# Patient Record
Sex: Female | Born: 1970
Health system: Southern US, Community
[De-identification: ages and names within clinical notes are randomized; demographics above are authoritative.]

## PROBLEM LIST (undated history)

## (undated) DIAGNOSIS — F419 Anxiety disorder, unspecified: Secondary | ICD-10-CM

## (undated) DIAGNOSIS — M1990 Unspecified osteoarthritis, unspecified site: Secondary | ICD-10-CM

## (undated) DIAGNOSIS — G4700 Insomnia, unspecified: Secondary | ICD-10-CM

## (undated) DIAGNOSIS — F32A Depression, unspecified: Secondary | ICD-10-CM

## (undated) DIAGNOSIS — I10 Essential (primary) hypertension: Secondary | ICD-10-CM

## (undated) DIAGNOSIS — E7800 Pure hypercholesterolemia, unspecified: Secondary | ICD-10-CM

## (undated) DIAGNOSIS — F329 Major depressive disorder, single episode, unspecified: Secondary | ICD-10-CM

## (undated) HISTORY — DX: Anxiety disorder, unspecified: F41.9

## (undated) HISTORY — DX: Essential (primary) hypertension: I10

## (undated) HISTORY — DX: Insomnia, unspecified: G47.00

## (undated) HISTORY — DX: Depression, unspecified: F32.A

## (undated) HISTORY — DX: Unspecified osteoarthritis, unspecified site: M19.90

## (undated) HISTORY — DX: Pure hypercholesterolemia, unspecified: E78.00

---
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
## 2006-09-09 DIAGNOSIS — N189 Chronic kidney disease, unspecified: Secondary | ICD-10-CM

## 2006-09-09 HISTORY — DX: Chronic kidney disease, unspecified: N18.9

## 2006-09-09 HISTORY — PX: NEPHRECTOMY: SHX65

## 2008-09-09 HISTORY — PX: VAGINAL HYSTERECTOMY: SUR661

## 2018-10-06 DIAGNOSIS — F4321 Adjustment disorder with depressed mood: Secondary | ICD-10-CM | POA: Insufficient documentation

## 2018-10-06 DIAGNOSIS — I10 Essential (primary) hypertension: Secondary | ICD-10-CM | POA: Insufficient documentation

## 2018-10-06 DIAGNOSIS — G4700 Insomnia, unspecified: Secondary | ICD-10-CM | POA: Insufficient documentation

## 2018-10-06 DIAGNOSIS — F411 Generalized anxiety disorder: Secondary | ICD-10-CM | POA: Insufficient documentation

## 2018-12-08 DIAGNOSIS — R5382 Chronic fatigue, unspecified: Secondary | ICD-10-CM | POA: Insufficient documentation

## 2018-12-31 DIAGNOSIS — M2550 Pain in unspecified joint: Secondary | ICD-10-CM | POA: Insufficient documentation

## 2019-08-12 DIAGNOSIS — E782 Mixed hyperlipidemia: Secondary | ICD-10-CM | POA: Insufficient documentation

## 2020-01-04 DIAGNOSIS — N9089 Other specified noninflammatory disorders of vulva and perineum: Secondary | ICD-10-CM | POA: Diagnosis not present

## 2020-01-04 DIAGNOSIS — N898 Other specified noninflammatory disorders of vagina: Secondary | ICD-10-CM | POA: Diagnosis not present

## 2020-01-05 DIAGNOSIS — N898 Other specified noninflammatory disorders of vagina: Secondary | ICD-10-CM | POA: Diagnosis not present

## 2020-01-07 DIAGNOSIS — M25562 Pain in left knee: Secondary | ICD-10-CM | POA: Diagnosis not present

## 2020-01-07 DIAGNOSIS — M1711 Unilateral primary osteoarthritis, right knee: Secondary | ICD-10-CM | POA: Diagnosis not present

## 2020-01-07 DIAGNOSIS — M25512 Pain in left shoulder: Secondary | ICD-10-CM | POA: Diagnosis not present

## 2020-01-07 DIAGNOSIS — M25561 Pain in right knee: Secondary | ICD-10-CM | POA: Diagnosis not present

## 2020-01-07 DIAGNOSIS — M170 Bilateral primary osteoarthritis of knee: Secondary | ICD-10-CM | POA: Diagnosis not present

## 2020-01-19 ENCOUNTER — Other Ambulatory Visit: Payer: Self-pay

## 2020-01-19 ENCOUNTER — Encounter: Payer: Self-pay | Admitting: Family

## 2020-01-19 ENCOUNTER — Ambulatory Visit (INDEPENDENT_AMBULATORY_CARE_PROVIDER_SITE_OTHER): Payer: BC Managed Care – PPO | Admitting: Family

## 2020-01-19 VITALS — BP 120/82 | HR 62 | Temp 97.1°F | Resp 16 | Ht 63.0 in | Wt 150.4 lb

## 2020-01-19 DIAGNOSIS — M159 Polyosteoarthritis, unspecified: Secondary | ICD-10-CM | POA: Insufficient documentation

## 2020-01-19 DIAGNOSIS — F321 Major depressive disorder, single episode, moderate: Secondary | ICD-10-CM

## 2020-01-19 DIAGNOSIS — I10 Essential (primary) hypertension: Secondary | ICD-10-CM | POA: Diagnosis not present

## 2020-01-19 DIAGNOSIS — R519 Headache, unspecified: Secondary | ICD-10-CM

## 2020-01-19 DIAGNOSIS — Z23 Encounter for immunization: Secondary | ICD-10-CM

## 2020-01-19 DIAGNOSIS — E785 Hyperlipidemia, unspecified: Secondary | ICD-10-CM | POA: Diagnosis not present

## 2020-01-19 DIAGNOSIS — R252 Cramp and spasm: Secondary | ICD-10-CM

## 2020-01-19 DIAGNOSIS — M8949 Other hypertrophic osteoarthropathy, multiple sites: Secondary | ICD-10-CM

## 2020-01-19 DIAGNOSIS — F5101 Primary insomnia: Secondary | ICD-10-CM

## 2020-01-19 DIAGNOSIS — F411 Generalized anxiety disorder: Secondary | ICD-10-CM

## 2020-01-19 DIAGNOSIS — E559 Vitamin D deficiency, unspecified: Secondary | ICD-10-CM | POA: Insufficient documentation

## 2020-01-19 DIAGNOSIS — R7989 Other specified abnormal findings of blood chemistry: Secondary | ICD-10-CM

## 2020-01-19 DIAGNOSIS — Z113 Encounter for screening for infections with a predominantly sexual mode of transmission: Secondary | ICD-10-CM

## 2020-01-19 DIAGNOSIS — Z6826 Body mass index (BMI) 26.0-26.9, adult: Secondary | ICD-10-CM

## 2020-01-19 DIAGNOSIS — R5383 Other fatigue: Secondary | ICD-10-CM

## 2020-01-19 MED ORDER — TETANUS-DIPHTH-ACELL PERTUSSIS 5-2.5-18.5 LF-MCG/0.5 IM SUSP: 0.5000 mL | Freq: Once | INTRAMUSCULAR | 0 refills | Status: AC

## 2020-01-19 NOTE — Patient Instructions (Signed)
-   continue current medication  - Schedule appointment for fasting lab work in 2-4 days or sooner  Welcome to [facility].

## 2020-01-19 NOTE — Progress Notes (Signed)
Provider: Marlowe Sax FNP-C   , Nelda Bucks, NP  Patient Care Team: , Nelda Bucks, NP as PCP - General (Family Medicine)  Extended Emergency Contact Information Primary Emergency Contact: zyon, grout Mobile Phone: [phone] Relation: Spouse  Code Status: Full Code  Goals of care: Advanced Directive information Advanced Directives 01/19/2020  Does Patient Have a Medical Advance Directive? No     Chief Complaint  Patient presents with  . Establish Care    New Patient   . Health Maintenance    Discuss the need for HIV Screening, and Pap Smear.  . Immunizations    Discuss the need for Tetanus Vaccine.    HPI:  Pt is a 49 y.o. female seen today to establish care for medical management of chronic diseases.She has a medical history of Hypertension ,Generali anxiety disorder,depression,chronic Kidney disease,Hyperlipidemia,Insomnia,allergic Rhinitis among other condition.she states recently relocated from [place] due to [employer] job.she was following up with McClees Angela,PA at [facility]. Also seen by OB/Gyn at [facility] for gental lesion.she was treated for Folliculitis.she has established with OB/GYN here in [place] states was seen a week ago had a pap smear done.doesn't recall the Gyn office name but will call.she complains of feeling tired most of the time and not being focused.  Hypertension - on losartan 50 mg tablet daily and metoprolol 100 mg 24 Hr tablet daily.checks blood pressure sometimes at home.No home readings for evaluation.Has had some headaches sometimes at the end of the day but goes away.No chest pain,palpitation or shortness of breath.   Hyperlipidemia - not on any medication.tries to watch what she eats.exercise is limited due to her osteoarthritis on the knees and left shoulder but tries to exercise occasionally about twice a week sometimes.  Insomnia - on Ambien 10 mg tablet daily at bedtime.states was  previous given Trazodone and other sleep aids but nothing works for her except Ambien.Has had no side effects.  Depression - on Celexa 40 mg tablet daily.States has been on Celexa for several years.Has failed other antidepressant prescribed by Psychiatrist including Cymbalta,subvenite,lamotragine .States depression seems to be stable for now has not had any up and downs.Dose was adjusted while in [place] due to death of her brother.Request referral for a Counsellor states has upcoming appointment with Psychiatry. Drinks 7 glasses of Alcohol per week  Also drinks coffee   Osteoarthritis - states has chronic bulging disc on the neck,left shoulder and bilateral knee pain.she gets cortisol injection with Orthopedic.she has established with Dr.Bean at [facility].States had a cortisol injection on right knee which seems to have helped with the pain.Plans to get gel injection on next visit.No records for review.   She is due for Tdap vaccine.discussed importance of getting Tdap vaccine verbalized understanding.due for HIV screening  Has not had any COVID-19 vaccine.states doesn't get yearly influence vaccine tries to eat health and takes vitamin C supplements.   Past Medical History:  Diagnosis Date  . Anxiety   . Arthritis   . Chronic kidney disease 2008   Nephrectomy   . Depression   . High blood pressure   . High cholesterol   . Insomnia    Past Surgical History:  Procedure Laterality Date  . NEPHRECTOMY  2008   Done in [place]  . VAGINAL HYSTERECTOMY  2010   Dr. Lucia Bitter    Allergies  Allergen Reactions  . Benzocaine-Menthol   . Cholecalciferol   . Codeine   . Penicillins   . Skin  Adhesives [Cyanoacrylate]   . Sulfa Antibiotics     Allergies as of 01/19/2020      Reactions   Benzocaine-menthol    Cholecalciferol    Codeine    Penicillins    Skin Adhesives [cyanoacrylate]    Sulfa Antibiotics       Medication List       Accurate as of Jan 19, 2020  9:17 PM.  If you have any questions, ask your nurse or doctor.        ALPRAZolam 0.25 MG tablet Commonly known as: XANAX Take 0.25 mg by mouth as needed for anxiety.   citalopram 40 MG tablet Commonly known as: CELEXA Take 40 mg by mouth daily.   losartan 50 MG tablet Commonly known as: COZAAR Take 50 mg by mouth daily.   MAGNESIUM BISGLYCINATE PO Take 1 capsule by mouth in the morning and at bedtime.   metoprolol succinate 100 MG 24 hr tablet Commonly known as: TOPROL-XL Take 100 mg by mouth daily. Take with or immediately following a meal. (Patient states 75 mg +25 mg)   OVER THE COUNTER MEDICATION Take 1 capsule by mouth daily. Magnesium L Threonate   Tdap 5-2.5-18.5 LF-MCG/0.5 injection Commonly known as: BOOSTRIX Inject 0.5 mLs into the muscle once for 1 dose.   testosterone cypionate 200 MG/ML injection Commonly known as: DEPOTESTOSTERONE CYPIONATE Inject 0.15 mg into the muscle every 14 (fourteen) days.   Vitamin D (Ergocalciferol) 1.25 MG (50000 UNIT) Caps capsule Commonly known as: DRISDOL Take 5,000 Units by mouth daily.   zolpidem 10 MG tablet Commonly known as: AMBIEN Take 10 mg by mouth at bedtime.       Review of Systems  Constitutional: Negative for activity change, appetite change, chills, fatigue and fever.  HENT: Negative for congestion, dental problem, postnasal drip, rhinorrhea, sinus pressure, sinus pain, sneezing, sore throat and trouble swallowing.   Eyes: Negative for discharge, redness, itching and visual disturbance.  Respiratory: Negative for cough, chest tightness, shortness of breath and wheezing.   Cardiovascular: Negative for chest pain, palpitations and leg swelling.  Gastrointestinal: Negative for abdominal distention, abdominal pain, blood in stool, constipation, diarrhea, nausea and vomiting.  Endocrine: Negative for cold intolerance, heat intolerance, polydipsia, polyphagia and polyuria.       Hot flush sometimes   Genitourinary:  Negative for decreased urine volume, difficulty urinating, dysuria, flank pain, frequency and urgency.  Musculoskeletal: Positive for arthralgias. Negative for gait problem, myalgias and neck pain.  Skin: Negative for color change, pallor, rash and wound.  Neurological: Negative for dizziness, speech difficulty, weakness, light-headedness and numbness.       Headaches sometimes in the evening.   Hematological: Does not bruise/bleed easily.  Psychiatric/Behavioral: Positive for sleep disturbance. Negative for agitation, behavioral problems and confusion. The patient is nervous/anxious.     There is no immunization history on file for this patient. Pertinent  Health Maintenance Due  Topic Date Due  . PAP SMEAR-Modifier  Never done  . INFLUENZA VACCINE  04/09/2020   Fall Risk  01/19/2020  Falls in the past year? 0  Number falls in past yr: 0  Injury with Fall? 0     Vitals:   01/19/20 0914  BP: 120/82  Pulse: 62  Resp: 16  Temp: (!) 97.1 F (36.2 C)  SpO2: 95%  Weight: 150 lb 6.4 oz (68.2 kg)  Height: _0  (1.6 m)   Body mass index is 26.64 kg/m. Physical Exam Vitals reviewed.  Constitutional:  General: She is not in acute distress.    Appearance: She is overweight. She is not ill-appearing.  HENT:     Head: Normocephalic.     Right Ear: Tympanic membrane, ear canal and external ear normal. There is no impacted cerumen.     Left Ear: Tympanic membrane, ear canal and external ear normal. There is no impacted cerumen.     Nose: Nose normal. No congestion or rhinorrhea.     Mouth/Throat:     Mouth: Mucous membranes are moist.     Pharynx: Oropharynx is clear. No oropharyngeal exudate or posterior oropharyngeal erythema.  Eyes:     General: No scleral icterus.       Right eye: No discharge.        Left eye: No discharge.     Extraocular Movements: Extraocular movements intact.     Conjunctiva/sclera: Conjunctivae normal.     Pupils: Pupils are equal, round, and  reactive to light.  Neck:     Vascular: No carotid bruit.  Cardiovascular:     Rate and Rhythm: Normal rate and regular rhythm.     Pulses: Normal pulses.     Heart sounds: Normal heart sounds. No murmur. No friction rub. No gallop.   Pulmonary:     Effort: Pulmonary effort is normal. No respiratory distress.     Breath sounds: Normal breath sounds. No wheezing, rhonchi or rales.  Chest:     Chest wall: No tenderness.  Abdominal:     General: Bowel sounds are normal. There is no distension.     Palpations: Abdomen is soft. There is no mass.     Tenderness: There is no abdominal tenderness. There is no right CVA tenderness, left CVA tenderness, guarding or rebound.  Musculoskeletal:        General: No swelling or tenderness. Normal range of motion.     Cervical back: Normal range of motion. No rigidity or tenderness.     Right lower leg: No edema.     Left lower leg: No edema.  Lymphadenopathy:     Cervical: No cervical adenopathy.  Skin:    General: Skin is warm.     Coloration: Skin is not pale.     Findings: No bruising, erythema or rash.  Neurological:     Mental Status: She is alert and oriented to person, place, and time.     Cranial Nerves: No cranial nerve deficit.     Sensory: No sensory deficit.     Motor: No weakness.     Coordination: Coordination normal.     Gait: Gait normal.  Psychiatric:        Mood and Affect: Mood normal.        Behavior: Behavior normal.        Thought Content: Thought content normal.        Judgment: Judgment normal.    Labs reviewed: Significant Diagnostic Results in last 30 days:  No results found.  Assessment/Plan 1. Essential hypertension B/p at goal. - continue on  losartan 50 mg tablet daily and metoprolol 100 mg 24 Hr tablet daily - Advised to check B/p at home and record.Notify provider's office if > 140/90 - CBC with Differential/Platelet; Future - CMP with eGFR([facility]); Future - TSH; Future  2. Hyperlipidemia LDL goal  <100 No recent LDL for review. - Encouraged to continue on dietary modification and exercise.Advised to do water aerobics due to her knees and shoulder pain. - Lipid panel; Future  3. Generalized anxiety disorder -  non pharmacological coping mechanism advised.Telephone numbers for [facility] area counselors provided for patient to call and establish care. - Continue on Alprazolam 0.25 mg tablet as needed.  - Has upcoming appointment to establish care with Psychiatrist in town.   4. Current moderate episode of major depressive disorder, unspecified whether recurrent (HCC) Mood stable. - Follow up with Psychiatrist as scheduled.several Telephone numbers for counselors given. - continue on Celexa 40 mg tablet daily.  - TSH; Future  5. Need for Tdap vaccination Advised to get Tdap vaccine at her pharmacy.script send to pharmacy. - Tdap (BOOSTRIX) 5-2.5-18.5 LF-MCG/0.5 injection; Inject 0.5 mLs into the muscle once for 1 dose.  Dispense: 0.5 mL; Refill: 0  6. Screening examination for venereal disease Discussed screening verbalized understanding and agrees for HIV lab to e ordered.   - HIV antibody (with reflex); Future  7. Body mass index 26.0-26.9, adult  -  Overweight  - BMI 26.64  Dietary and exercise modification advised  8. Vitamin D deficiency - continue on vitamin D 5000 capsule - Vitamin D, 1,25-dihydroxy; Future  9. Fatigue, unspecified type Unclear etiology. - encouraged good balance diet,exercise and sleep 6-8 hrs. Will check vitamin B12 level.  - Vitamin B12; Future  10. Muscle cramps - Encouraged to increase water intake to 6-8 glasses daily  - continue on Magnesium capsule one by mouth twice daily.  - Magnesium; Future  11. Nonintractable episodic headache, unspecified headache type Intermittent headaches at the end of the day which resolve.suspect possible stress related. - encouraged to increase water intake  - over the counter analgesics as needed.will  consider referral to Neurologist if headaches worsen.  - CBC with Differential/Platelet; Future  12. Low testosterone level in female - chronic  - continue on Testosterone 0.15 mg injection every 14 days as directed by [place].  13 Insomnia  Chronic  Zolpidem side effects discussed.verbalized understanding has tried several other medication but nothing works for her. - continue on Zolpidem 10 mg tablet daily  - follow up with Psychiatrist as scheduled.   14.Osteoarthritis of multiple sites  Bilateral knees,left shoulder and neck  - continue to follow up with Orthopedic specialist for cortisol injection as directed.   15.Hx of Nephrectomy  Reports hx of nephrectomy in 2008.No records for review.  - Avoid NSAIDs, and dose other medication for renal clearances.  Family/ staff Communication: Reviewed plan of care with patient verbalized understanding.   Labs/tests ordered:   - CBC with Differential/Platelet; Future - CMP with eGFR([facility]); Future - TSH; Future - HIV antibody (with reflex); Future - Vitamin B12; Future - Lipid panel; Future  Next Appointment : 6 months for medical management of chronic issues.Labs in 2-4 days or sooner.  Sandrea Hughs, NP

## 2020-01-21 ENCOUNTER — Other Ambulatory Visit: Payer: Self-pay

## 2020-01-21 ENCOUNTER — Other Ambulatory Visit: Payer: BC Managed Care – PPO

## 2020-01-21 ENCOUNTER — Telehealth: Payer: Self-pay | Admitting: *Deleted

## 2020-01-21 DIAGNOSIS — K5901 Slow transit constipation: Secondary | ICD-10-CM

## 2020-01-21 DIAGNOSIS — E785 Hyperlipidemia, unspecified: Secondary | ICD-10-CM

## 2020-01-21 DIAGNOSIS — E559 Vitamin D deficiency, unspecified: Secondary | ICD-10-CM | POA: Diagnosis not present

## 2020-01-21 DIAGNOSIS — Z113 Encounter for screening for infections with a predominantly sexual mode of transmission: Secondary | ICD-10-CM

## 2020-01-21 DIAGNOSIS — I10 Essential (primary) hypertension: Secondary | ICD-10-CM | POA: Diagnosis not present

## 2020-01-21 DIAGNOSIS — R5383 Other fatigue: Secondary | ICD-10-CM | POA: Diagnosis not present

## 2020-01-21 DIAGNOSIS — R252 Cramp and spasm: Secondary | ICD-10-CM

## 2020-01-21 MED ORDER — METOPROLOL SUCCINATE ER 50 MG PO TB24: ORAL_TABLET | ORAL | 3 refills | Status: DC

## 2020-01-21 MED ORDER — CITALOPRAM HYDROBROMIDE 40 MG PO TABS: 40.0000 mg | ORAL_TABLET | Freq: Every day | ORAL | 1 refills | Status: DC

## 2020-01-21 MED ORDER — ZOLPIDEM TARTRATE 10 MG PO TABS: 10.0000 mg | ORAL_TABLET | Freq: Every day | ORAL | Status: DC

## 2020-01-21 MED ORDER — ALPRAZOLAM 0.25 MG PO TABS: ORAL_TABLET | ORAL | 1 refills | Status: DC

## 2020-01-21 MED ORDER — LINACLOTIDE 72 MCG PO CAPS: 72.0000 ug | ORAL_CAPSULE | Freq: Every day | ORAL | 2 refills | Status: DC

## 2020-01-21 MED ORDER — METOPROLOL SUCCINATE ER 25 MG PO TB24: ORAL_TABLET | ORAL | 3 refills | Status: DC

## 2020-01-21 NOTE — Telephone Encounter (Signed)
Okay 

## 2020-01-21 NOTE — Telephone Encounter (Signed)
72 MCG  rx sent to pharmacy by e-script

## 2020-01-21 NOTE — Telephone Encounter (Signed)
Patient walked into office with her bottles of medications.  Reviewed medications.Patient did not have bottles for Alprazolam, Citalopram and Ambien that is prescribed by Psychiatrist per patient. Also DID NOT have bottle for Testosterone Cypionate.    Spoke with Dinah and she stated that patient should not be taking: Metoprolol Tartrate and Metoprolol Succ. Dinah stated to send in Rx for Metoprolol Succ ER 50mg  once daily along with Metoprolol Succ ER 25mg  once daily to total 75mg  daily.   Corrected medication in current medication list.  Patient notified to STOP Metoprolol Tart 50 twice daily. Agreed to start Metoprolol Succ 25mg  and 50mg . For total of 75mg  daily.   Patient aware of the directions and agreed.   Pended Rx's and sent to [facility] for approval.

## 2020-01-21 NOTE — Telephone Encounter (Signed)
Patient called and would like a prescription for Linzess to make he have a regular bowel movement. She was on it in the pass and stated it works well for her. Please advise .

## 2020-01-21 NOTE — Telephone Encounter (Signed)
How much was she taking.Okay to fill.

## 2020-01-25 ENCOUNTER — Other Ambulatory Visit: Payer: Self-pay

## 2020-01-25 DIAGNOSIS — E785 Hyperlipidemia, unspecified: Secondary | ICD-10-CM

## 2020-01-25 DIAGNOSIS — I10 Essential (primary) hypertension: Secondary | ICD-10-CM

## 2020-01-26 ENCOUNTER — Other Ambulatory Visit: Payer: Self-pay

## 2020-01-26 DIAGNOSIS — E785 Hyperlipidemia, unspecified: Secondary | ICD-10-CM

## 2020-01-26 LAB — LIPID PANEL
Cholesterol: 210 mg/dL — ABNORMAL HIGH
HDL: 41 mg/dL — ABNORMAL LOW
LDL Cholesterol (Calc): 141 mg/dL — ABNORMAL HIGH
Non-HDL Cholesterol (Calc): 169 mg/dL — ABNORMAL HIGH
Total CHOL/HDL Ratio: 5.1 (calc) — ABNORMAL HIGH
Triglycerides: 151 mg/dL — ABNORMAL HIGH

## 2020-01-26 LAB — COMPLETE METABOLIC PANEL WITHOUT GFR
AG Ratio: 1.5 (calc) (ref 1.0–2.5)
ALT: 10 U/L (ref 6–29)
AST: 12 U/L (ref 10–35)
Albumin: 4 g/dL (ref 3.6–5.1)
Alkaline phosphatase (APISO): 38 U/L (ref 31–125)
BUN: 9 mg/dL (ref 7–25)
CO2: 27 mmol/L (ref 20–32)
Calcium: 9.2 mg/dL (ref 8.6–10.2)
Chloride: 105 mmol/L (ref 98–110)
Creat: 0.76 mg/dL (ref 0.50–1.10)
GFR, Est African American: 108 mL/min/1.73m2
GFR, Est Non African American: 93 mL/min/1.73m2
Globulin: 2.6 g/dL (ref 1.9–3.7)
Glucose, Bld: 90 mg/dL (ref 65–99)
Potassium: 4.3 mmol/L (ref 3.5–5.3)
Sodium: 137 mmol/L (ref 135–146)
Total Bilirubin: 1.3 mg/dL — ABNORMAL HIGH (ref 0.2–1.2)
Total Protein: 6.6 g/dL (ref 6.1–8.1)

## 2020-01-26 LAB — VITAMIN B12: Vitamin B-12: 696 pg/mL (ref 200–1100)

## 2020-01-26 LAB — HIV ANTIBODY (ROUTINE TESTING W REFLEX): HIV 1&2 Ab, 4th Generation: NONREACTIVE

## 2020-01-26 LAB — MAGNESIUM: Magnesium: 1.7 mg/dL (ref 1.5–2.5)

## 2020-01-26 LAB — VITAMIN D 1,25 DIHYDROXY
Vitamin D 1, 25 (OH)2 Total: 55 pg/mL (ref 18–72)
Vitamin D2 1, 25 (OH)2: 11 pg/mL
Vitamin D3 1, 25 (OH)2: 44 pg/mL

## 2020-01-26 LAB — TSH: TSH: 1.85 mIU/L

## 2020-01-26 MED ORDER — ROSUVASTATIN CALCIUM 5 MG PO TABS: 5.0000 mg | ORAL_TABLET | Freq: Every day | ORAL | 3 refills | Status: DC

## 2020-01-26 NOTE — Telephone Encounter (Signed)
Patient with HLD. Plan was for followup labs in 6 months. Patient stated she had had HLD for a while and would rather start on medication sooner. Crestor 5 mg, #30 x3 sent to pharmacy and patient notified.

## 2020-01-31 DIAGNOSIS — M7542 Impingement syndrome of left shoulder: Secondary | ICD-10-CM | POA: Diagnosis not present

## 2020-01-31 DIAGNOSIS — M25512 Pain in left shoulder: Secondary | ICD-10-CM | POA: Diagnosis not present

## 2020-01-31 DIAGNOSIS — M25562 Pain in left knee: Secondary | ICD-10-CM | POA: Diagnosis not present

## 2020-01-31 DIAGNOSIS — M25561 Pain in right knee: Secondary | ICD-10-CM | POA: Diagnosis not present

## 2020-02-22 DIAGNOSIS — M170 Bilateral primary osteoarthritis of knee: Secondary | ICD-10-CM | POA: Diagnosis not present

## 2020-03-06 ENCOUNTER — Encounter: Payer: BC Managed Care – PPO | Admitting: Obstetrics and Gynecology

## 2020-03-09 DIAGNOSIS — M170 Bilateral primary osteoarthritis of knee: Secondary | ICD-10-CM | POA: Diagnosis not present

## 2020-03-14 ENCOUNTER — Telehealth: Payer: Self-pay

## 2020-03-14 NOTE — Telephone Encounter (Signed)
Patient called to ask if [facility] would take over the care of her medications since she is a new [place] the 2 of them in particular are her Ambien and Celexa I informed her that a refill for these 2 medications was sent to her pharmacy on 01/21/2020 and she should check to see if they could still do the refill request if not to call the office and we could resend it for her

## 2020-03-22 DIAGNOSIS — M170 Bilateral primary osteoarthritis of knee: Secondary | ICD-10-CM | POA: Diagnosis not present

## 2020-03-31 DIAGNOSIS — M170 Bilateral primary osteoarthritis of knee: Secondary | ICD-10-CM | POA: Diagnosis not present

## 2020-03-31 DIAGNOSIS — M25571 Pain in right ankle and joints of right foot: Secondary | ICD-10-CM | POA: Diagnosis not present

## 2020-04-05 ENCOUNTER — Other Ambulatory Visit: Payer: Self-pay | Admitting: Family

## 2020-04-05 DIAGNOSIS — Z1231 Encounter for screening mammogram for malignant neoplasm of breast: Secondary | ICD-10-CM | POA: Diagnosis not present

## 2020-04-09 ENCOUNTER — Other Ambulatory Visit: Payer: Self-pay | Admitting: Family

## 2020-04-10 ENCOUNTER — Other Ambulatory Visit: Payer: Self-pay | Admitting: *Deleted

## 2020-04-10 MED ORDER — CITALOPRAM HYDROBROMIDE 40 MG PO TABS: 40.0000 mg | ORAL_TABLET | Freq: Every day | ORAL | 0 refills | Status: DC

## 2020-04-10 MED ORDER — ALPRAZOLAM 0.25 MG PO TABS: ORAL_TABLET | ORAL | 0 refills | Status: DC

## 2020-04-10 NOTE — Telephone Encounter (Signed)
Patient called and stated that she needs refills on medications.  I informed her that these need to be prescribed by the Psych.  She stated that she has not found one yet and they are all booked out.   The numbers to [facility] and [facility] given to patient to call.   Wants to know if you will refill until she can get into one.  Please Advise.

## 2020-04-11 ENCOUNTER — Telehealth: Payer: Self-pay | Admitting: *Deleted

## 2020-04-11 NOTE — Telephone Encounter (Signed)
Please schedule appointment to check blood pressure at the office.

## 2020-04-11 NOTE — Telephone Encounter (Signed)
Patient called with blood pressure readings: 04/09/20  144/91 04/10/20  165/110 04/11/20  137/92  Patient is currently taking Metoprolol 100mg  once daily and Losartan 50mg  once daily.   Patient is wondering if her Xanax or Celexa could be increased. Stated that this is the only thing that helps calm her down.   Please Advise.

## 2020-04-12 NOTE — Telephone Encounter (Signed)
LMOM to return call.

## 2020-04-13 NOTE — Telephone Encounter (Signed)
Patient notified and agreed. Appointment scheduled with Dinah for 04/14/2020.

## 2020-04-14 ENCOUNTER — Other Ambulatory Visit: Payer: Self-pay

## 2020-04-14 ENCOUNTER — Encounter: Payer: Self-pay | Admitting: Family

## 2020-04-14 ENCOUNTER — Ambulatory Visit (INDEPENDENT_AMBULATORY_CARE_PROVIDER_SITE_OTHER): Payer: BC Managed Care – PPO | Admitting: Family

## 2020-04-14 VITALS — BP 132/92 | HR 90 | Temp 97.7°F | Resp 16 | Ht 63.0 in | Wt 151.0 lb

## 2020-04-14 DIAGNOSIS — F411 Generalized anxiety disorder: Secondary | ICD-10-CM

## 2020-04-14 DIAGNOSIS — M8949 Other hypertrophic osteoarthropathy, multiple sites: Secondary | ICD-10-CM

## 2020-04-14 DIAGNOSIS — F321 Major depressive disorder, single episode, moderate: Secondary | ICD-10-CM

## 2020-04-14 DIAGNOSIS — F5101 Primary insomnia: Secondary | ICD-10-CM

## 2020-04-14 DIAGNOSIS — E785 Hyperlipidemia, unspecified: Secondary | ICD-10-CM

## 2020-04-14 DIAGNOSIS — M159 Polyosteoarthritis, unspecified: Secondary | ICD-10-CM

## 2020-04-14 DIAGNOSIS — I10 Essential (primary) hypertension: Secondary | ICD-10-CM | POA: Diagnosis not present

## 2020-04-14 DIAGNOSIS — Z01818 Encounter for other preprocedural examination: Secondary | ICD-10-CM | POA: Diagnosis not present

## 2020-04-14 MED ORDER — LOSARTAN POTASSIUM 50 MG PO TABS: 75.0000 mg | ORAL_TABLET | Freq: Every day | ORAL | 1 refills | Status: DC

## 2020-04-14 NOTE — Patient Instructions (Addendum)
- Please schedule appointment with dentist for annual teeth cleaning.  - check blood pressure daily after relaxing for a few minutes. - Follow up with counseling service  Stress, Adult Stress is a normal reaction to life events. Stress is what you feel when life demands more than you are used to, or more than you think you can handle. Some stress can be useful, such as studying for a test or meeting a deadline at work. Stress that occurs too often or for too long can cause problems. It can affect your emotional health and interfere with relationships and normal daily activities. Too much stress can weaken your body's defense system (immune system) and increase your risk for physical illness. If you already have a medical problem, stress can make it worse. What are the causes? All sorts of life events can cause stress. An event that causes stress for one person may not be stressful for another person. Major life events, whether positive or negative, commonly cause stress. Examples include:  Losing a job or starting a new job.  Losing a loved one.  Moving to a new town or home.  Getting married or divorced.  Having a baby.  Getting injured or sick. Less obvious life events can also cause stress, especially if they occur day after day or in combination with each other. Examples include:  Working long hours.  Driving in traffic.  Caring for children.  Being in debt.  Being in a difficult relationship. What are the signs or symptoms? Stress can cause emotional symptoms, including:  Anxiety. This is feeling worried, afraid, on edge, overwhelmed, or out of control.  Anger, including irritation or impatience.  Depression. This is feeling sad, down, helpless, or guilty.  Trouble focusing, remembering, or making decisions. Stress can cause physical symptoms, including:  Aches and pains. These may affect your head, neck, back, stomach, or other areas of your body.  Tight muscles or  a clenched jaw.  Low energy.  Trouble sleeping. Stress can cause unhealthy behaviors, including:  Eating to feel better (overeating) or skipping meals.  Working too much or putting off tasks.  Smoking, drinking alcohol, or using drugs to feel better. How is this diagnosed? Stress is diagnosed through an assessment by your health care provider. He or she may diagnose this condition based on:  Your symptoms and any stressful life events.  Your medical history.  Tests to rule out other causes of your symptoms. Depending on your condition, your health care provider may refer you to a specialist for further evaluation. How is this treated?  Stress management techniques are the recommended treatment for stress. Medicine is not typically recommended for the treatment of stress. Techniques to reduce your reaction to stressful life events include:  Stress identification. Monitor yourself for symptoms of stress and identify what causes stress for you. These skills may help you to avoid or prepare for stressful events.  Time management. Set your priorities, keep a calendar of events, and learn to say no. Taking these actions can help you avoid making too many commitments. Techniques for coping with stress include:  Rethinking the problem. Try to think realistically about stressful events rather than ignoring them or overreacting. Try to find the positives in a stressful situation rather than focusing on the negatives.  Exercise. Physical exercise can release both physical and emotional tension. The key is to find a form of exercise that you enjoy and do it regularly.  Relaxation techniques. These relax the body and mind.  The key is to find one or more that you enjoy and use the techniques regularly. Examples include: ? Meditation, deep breathing, or progressive relaxation techniques. ? Yoga or tai chi. ? Biofeedback, mindfulness techniques, or journaling. ? Listening to music, being out  in nature, or participating in other hobbies.  Practicing a healthy lifestyle. Eat a balanced diet, drink plenty of water, limit or avoid caffeine, and get plenty of sleep.  Having a strong support network. Spend time with family, friends, or other people you enjoy being around. Express your feelings and talk things over with someone you trust. Counseling or talk therapy with a mental health professional may be helpful if you are having trouble managing stress on your own. Follow these instructions at home: Lifestyle   Avoid drugs.  Do not use any products that contain nicotine or tobacco, such as cigarettes, e-cigarettes, and chewing tobacco. If you need help quitting, ask your health care provider.  Limit alcohol intake to no more than 1 drink a day for nonpregnant women and 2 drinks a day for men. One drink equals 12 oz of beer, 5 oz of wine, or 1 oz of hard liquor  Do not use alcohol or drugs to relax.  Eat a balanced diet that includes fresh fruits and vegetables, whole grains, lean meats, fish, eggs, and beans, and low-fat dairy. Avoid processed foods and foods high in added fat, sugar, and salt.  Exercise at least 30 minutes on 5 or more days each week.  Get 7-8 hours of sleep each night. General instructions   Practice stress management techniques as discussed with your health care provider.  Drink enough fluid to keep your urine clear or pale yellow.  Take over-the-counter and prescription medicines only as told by your health care provider.  Keep all follow-up visits as told by your health care provider. This is important. Contact a health care provider if:  Your symptoms get worse.  You have new symptoms.  You feel overwhelmed by your problems and can no longer manage them on your own. Get help right away if:  You have thoughts of hurting yourself or others. If you ever feel like you may hurt yourself or others, or have thoughts about taking your own life, get  help right away. You can go to your nearest emergency department or call:  Your local emergency services (911 in the [place]  A suicide crisis helpline, such as the [facility] at 782 066 6944. This is open 24 hours a day. Summary  Stress is a normal reaction to life events. It can cause problems if it happens too often or for too long.  Practicing stress management techniques is the best way to treat stress.  Counseling or talk therapy with a mental health professional may be helpful if you are having trouble managing stress on your own. This information is not intended to replace advice given to you by your health care provider. Make sure you discuss any questions you have with your health care provider. Document Revised: 03/26/2019 Document Reviewed: 10/16/2016 Elsevier Patient Education  [place].    Hypertension, Adult Hypertension is another name for high blood pressure. High blood pressure forces your heart to work harder to pump blood. This can cause problems over time. There are two numbers in a blood pressure reading. There is a top number (systolic) over a bottom number (diastolic). It is best to have a blood pressure that is below 120/80. Healthy choices can help lower your  blood pressure, or you may need medicine to help lower it. What are the causes? The cause of this condition is not known. Some conditions may be related to high blood pressure. What increases the risk?  Smoking.  Having type 2 diabetes mellitus, high cholesterol, or both.  Not getting enough exercise or physical activity.  Being overweight.  Having too much fat, sugar, calories, or salt (sodium) in your diet.  Drinking too much alcohol.  Having long-term (chronic) kidney disease.  Having a family history of high blood pressure.  Age. Risk increases with age.  Race. You may be at higher risk if you are African American.  Gender. Men are at higher risk than  women before age 56. After age 78, women are at higher risk than men.  Having obstructive sleep apnea.  Stress. What are the signs or symptoms?  High blood pressure may not cause symptoms. Very high blood pressure (hypertensive crisis) may cause: ? Headache. ? Feelings of worry or nervousness (anxiety). ? Shortness of breath. ? Nosebleed. ? A feeling of being sick to your stomach (nausea). ? Throwing up (vomiting). ? Changes in how you see. ? Very bad chest pain. ? Seizures. How is this treated?  This condition is treated by making healthy lifestyle changes, such as: ? Eating healthy foods. ? Exercising more. ? Drinking less alcohol.  Your health care provider may prescribe medicine if lifestyle changes are not enough to get your blood pressure under control, and if: ? Your top number is above 130. ? Your bottom number is above 80.  Your personal target blood pressure may vary. Follow these instructions at home: Eating and drinking   If told, follow the DASH eating plan. To follow this plan: ? Fill one half of your plate at each meal with fruits and vegetables. ? Fill one fourth of your plate at each meal with whole grains. Whole grains include whole-wheat pasta, brown rice, and whole-grain bread. ? Eat or drink low-fat dairy products, such as skim milk or low-fat yogurt. ? Fill one fourth of your plate at each meal with low-fat (lean) proteins. Low-fat proteins include fish, chicken without skin, eggs, beans, and tofu. ? Avoid fatty meat, cured and processed meat, or chicken with skin. ? Avoid pre-made or processed food.  Eat less than 1,500 mg of salt each day.  Do not drink alcohol if: ? Your doctor tells you not to drink. ? You are pregnant, may be pregnant, or are planning to become pregnant.  If you drink alcohol: ? Limit how much you use to:  0-1 drink a day for women.  0-2 drinks a day for men. ? Be aware of how much alcohol is in your drink. In the [place]  one drink equals one 12 oz bottle of beer (355 mL), one 5 oz glass of wine (148 mL), or one 1 oz glass of hard liquor (44 mL). Lifestyle   Work with your doctor to stay at a healthy weight or to lose weight. Ask your doctor what the best weight is for you.  Get at least 30 minutes of exercise most days of the week. This may include walking, swimming, or biking.  Get at least 30 minutes of exercise that strengthens your muscles (resistance exercise) at least 3 days a week. This may include lifting weights or doing Pilates.  Do not use any products that contain nicotine or tobacco, such as cigarettes, e-cigarettes, and chewing tobacco. If you need help quitting, ask your doctor.  Check your blood pressure at home as told by your doctor.  Keep all follow-up visits as told by your doctor. This is important. Medicines  Take over-the-counter and prescription medicines only as told by your doctor. Follow directions carefully.  Do not skip doses of blood pressure medicine. The medicine does not work as well if you skip doses. Skipping doses also puts you at risk for problems.  Ask your doctor about side effects or reactions to medicines that you should watch for. Contact a doctor if you:  Think you are having a reaction to the medicine you are taking.  Have headaches that keep coming back (recurring).  Feel dizzy.  Have swelling in your ankles.  Have trouble with your vision. Get help right away if you:  Get a very bad headache.  Start to feel mixed up (confused).  Feel weak or numb.  Feel faint.  Have very bad pain in your: ? Chest. ? Belly (abdomen).  Throw up more than once.  Have trouble breathing. Summary  Hypertension is another name for high blood pressure.  High blood pressure forces your heart to work harder to pump blood.  For most people, a normal blood pressure is less than 120/80.  Making healthy choices can help lower blood pressure. If your blood  pressure does not get lower with healthy choices, you may need to take medicine. This information is not intended to replace advice given to you by your health care provider. Make sure you discuss any questions you have with your health care provider. Document Revised: 05/06/2018 Document Reviewed: 05/06/2018 Elsevier Patient Education  2020 [employer].

## 2020-04-14 NOTE — Progress Notes (Signed)
Provider: Richarda Bladeinah Ngetich FNP-C   Ngetich, Donalee Citrininah C, NP  Patient Care Team: Ngetich, Donalee Citrininah C, NP as PCP - General (Family Medicine)  Extended Emergency Contact Information Primary Emergency Contact: Gunnar Bullarahman, aziz Mobile Phone: [phone] Relation: Spouse  Code Status:  Full Code  Goals of care: Advanced Directive information Advanced Directives 04/14/2020  Does Patient Have a Medical Advance Directive? No  Would patient like information on creating a medical advance directive? No - Patient declined     Chief Complaint  Patient presents with  . Medical Management of Chronic Issues    3 Month Follow Up on Blood Pressure.    HPI:  Pt is a 49 y.o. female seen today for medical management of chronic diseases.she is concern about her blood pressure today.Her home reading runs in the 130's/90's - 160's/100's.she states gets stressed out when she talks to her daughter.she gets a headache.she checks her blood pressure whenever she has a headache and it's always high.Discussed ways to manage her stress.she was given phone numbers on previous visit to get counseling but states has not called yet but will try to contact a counsel soon. Has required Alprazolam 0.25 mg tablet daily.  She does not exercise.  Constipation - has required dulcolax 3-4 days.Has stopped taking Linziness states gives her gas and abdominal.appetite is good. Continues to require Ambien for sleep.states no side effects during the day.states Ambien is the only medication that works for her insomnia.Has tried trazodone and melatonin in the past.   Past Medical History:  Diagnosis Date  . Anxiety   . Arthritis   . Chronic kidney disease 2008   Nephrectomy   . Depression   . High blood pressure   . High cholesterol   . Insomnia    Past Surgical History:  Procedure Laterality Date  . NEPHRECTOMY  2008   Done in [place]  . VAGINAL HYSTERECTOMY  2010   Dr. Ernie Hewhomas Searle    Allergies  Allergen Reactions  .  Benzocaine-Menthol   . Cholecalciferol   . Codeine   . Penicillins   . Skin Adhesives [Cyanoacrylate]   . Iodinated Diagnostic Agents Rash  . Sulfa Antibiotics Rash    Allergies as of 04/14/2020      Reactions   Benzocaine-menthol    Cholecalciferol    Codeine    Penicillins    Skin Adhesives [cyanoacrylate]    Iodinated Diagnostic Agents Rash   Sulfa Antibiotics Rash      Medication List       Accurate as of April 14, 2020  9:11 AM. If you have any questions, ask your nurse or doctor.        ALPRAZolam 0.25 MG tablet Commonly known as: XANAX Take one tablet by mouth every other day as needed for severe anxiety. (prescribed by Psych.)   citalopram 40 MG tablet Commonly known as: CELEXA Take 1 tablet (40 mg total) by mouth daily. (prescribed by Psych)   Linzess 72 MCG capsule Generic drug: linaclotide Take 72 mcg by mouth as needed. What changed: Another medication with the same name was removed. Continue taking this medication, and follow the directions you see here. Changed by: Caesar Bookmaninah C Ngetich, NP   losartan 50 MG tablet Commonly known as: COZAAR Take 50 mg by mouth daily.   MAGNESIUM BISGLYCINATE PO Take 1 capsule by mouth as needed.   metoprolol succinate 25 MG 24 hr tablet Commonly known as: TOPROL-XL Take one tablet by mouth once daily along with 50mg  to total 75mg  daily.  metoprolol succinate 50 MG 24 hr tablet Commonly known as: TOPROL-XL Take one tablet by mouth once daily along with 25mg  to total 75mg  daily. Take with or immediately following a meal.   rosuvastatin 5 MG tablet Commonly known as: CRESTOR Take 1 tablet (5 mg total) by mouth daily.   testosterone cypionate 200 MG/ML injection Commonly known as: DEPOTESTOSTERONE CYPIONATE Inject 0.15 mg into the muscle every 14 (fourteen) days. No bottle shown for this medication   Vitamin D (Ergocalciferol) 1.25 MG (50000 UNIT) Caps capsule Commonly known as: DRISDOL Take 5,000 Units by mouth  daily.   zolpidem 10 MG tablet Commonly known as: AMBIEN Take 1 tablet (10 mg total) by mouth at bedtime. (prescribed by Psych)       Review of Systems  Constitutional: Negative for appetite change, chills, fatigue and fever.  HENT: Negative for congestion, rhinorrhea, sinus pressure, sinus pain, sneezing and sore throat.   Eyes: Negative for discharge, redness and itching.  Respiratory: Negative for cough, chest tightness, shortness of breath and wheezing.   Cardiovascular: Negative for chest pain, palpitations and leg swelling.  Gastrointestinal: Negative for abdominal distention, abdominal pain, constipation, diarrhea, nausea and vomiting.  Endocrine: Negative for cold intolerance, heat intolerance, polydipsia and polyphagia.  Genitourinary: Negative for difficulty urinating, dysuria, flank pain, frequency and urgency.  Musculoskeletal: Negative for arthralgias, back pain, gait problem and joint swelling.  Skin: Negative for color change, pallor and rash.  Neurological: Negative for dizziness, speech difficulty, weakness, light-headedness, numbness and headaches.  Hematological: Does not bruise/bleed easily.  Psychiatric/Behavioral: Negative for agitation, behavioral problems, sleep disturbance and suicidal ideas. The patient is nervous/anxious.      There is no immunization history on file for this patient. Pertinent  Health Maintenance Due  Topic Date Due  . PAP SMEAR-Modifier  Never done  . INFLUENZA VACCINE  04/09/2020   Fall Risk  04/14/2020 01/19/2020  Falls in the past year? 0 0  Number falls in past yr: 0 0  Injury with Fall? 0 0    Vitals:   04/14/20 0847  BP: (!) 132/92  Pulse: 90  Resp: 16  Temp: 97.7 F (36.5 C)  SpO2: 98%  Weight: 151 lb (68.5 kg)  Height: 5\' 3"  (1.6 m)   Body mass index is 26.75 kg/m. Physical Exam Vitals reviewed.  Constitutional:      General: She is not in acute distress.    Appearance: She is overweight. She is not  ill-appearing.  HENT:     Head: Normocephalic.     Right Ear: Tympanic membrane, ear canal and external ear normal. There is no impacted cerumen.     Left Ear: Tympanic membrane, ear canal and external ear normal. There is no impacted cerumen.     Nose: Nose normal. No congestion or rhinorrhea.     Mouth/Throat:     Mouth: Mucous membranes are moist.     Pharynx: Oropharynx is clear. No oropharyngeal exudate or posterior oropharyngeal erythema.  Eyes:     General: No scleral icterus.       Right eye: No discharge.        Left eye: No discharge.     Extraocular Movements: Extraocular movements intact.     Conjunctiva/sclera: Conjunctivae normal.     Pupils: Pupils are equal, round, and reactive to light.  Neck:     Vascular: No carotid bruit.  Cardiovascular:     Rate and Rhythm: Normal rate and regular rhythm.     Pulses: Normal pulses.  Heart sounds: Normal heart sounds. No murmur heard.  No friction rub. No gallop.   Pulmonary:     Effort: Pulmonary effort is normal. No respiratory distress.     Breath sounds: Normal breath sounds. No wheezing, rhonchi or rales.  Chest:     Chest wall: No tenderness.  Abdominal:     General: Bowel sounds are normal. There is no distension.     Palpations: Abdomen is soft. There is no mass.     Tenderness: There is no abdominal tenderness. There is no right CVA tenderness, left CVA tenderness, guarding or rebound.  Musculoskeletal:        General: No swelling or tenderness. Normal range of motion.     Cervical back: Normal range of motion. No rigidity or tenderness.     Right lower leg: No edema.     Left lower leg: No edema.  Lymphadenopathy:     Cervical: No cervical adenopathy.  Skin:    General: Skin is warm and dry.     Coloration: Skin is not pale.     Findings: No bruising, erythema or rash.  Neurological:     Mental Status: She is alert and oriented to person, place, and time.     Cranial Nerves: No cranial nerve deficit.      Sensory: No sensory deficit.     Motor: No weakness.     Coordination: Coordination normal.     Gait: Gait normal.     Deep Tendon Reflexes: Reflexes normal.  Psychiatric:        Mood and Affect: Mood is anxious.        Speech: Speech normal.        Behavior: Behavior normal.        Thought Content: Thought content normal.        Judgment: Judgment normal.    Labs reviewed: Recent Labs    01/21/20 0816  NA 137  K 4.3  CL 105  CO2 27  GLUCOSE 90  BUN 9  CREATININE 0.76  CALCIUM 9.2  MG 1.7   Recent Labs    01/21/20 0816  AST 12  ALT 10  BILITOT 1.3*  PROT 6.6    Lab Results  Component Value Date   TSH 1.85 01/21/2020   No results found for: HGBA1C Lab Results  Component Value Date   CHOL 210 (H) 01/21/2020   HDL 41 (L) 01/21/2020   LDLCALC 141 (H) 01/21/2020   TRIG 151 (H) 01/21/2020   CHOLHDL 5.1 (H) 01/21/2020    Significant Diagnostic Results in last 30 days:  No results found.  Assessment/Plan 1. Essential hypertension Increased stress level contributing to her high blood pressure advised again this visit to seeking counseling for management of stress,anxietyand depression has several counseling and psychiatry services numbers to call.will increase losartan from 50 mg tablet to 75 mg tablet.continue on Metoprolol. - advised to check blood pressure daily after relaxing for a few minutes. - EKG 12-Lead indicates sinus rhythm with nonspecific T-abnormality Heart rate 63 b/minute.No other EKG for comparison. Asymptomatic.  - losartan (COZAAR) 50 MG tablet; Take 1.5 tablets (75 mg total) by mouth daily.  Dispense: 45 tablet; Refill: 1  2. Hyperlipidemia LDL goal <100 LDL not at goal. Crestor 5 mg tablet daily.  3. Generalized anxiety disorder Has required Alprazolam 0.25 mg tablet daily.continue on citalopram 40 mg tablet daily. - Advised to seek counseling as directed on previous visit. Has several counseling and psychiatry service numbers provided on  previous visit.  4. Current moderate episode of major depressive disorder, unspecified whether recurrent (HCC) - continue on citalopram 40 mg tablet daily. - counseling and psychiatry service as above  - Additional education information provided on AVS for stress management   5. Primary insomnia Would like to wean off Ambien but melatonin and trazodone has been ineffective. Continue on Ambien 10 mg tablet at bedtime.  6. Primary osteoarthritis involving multiple joints Continue on OTC analgesics.   Family/ staff Communication: Reviewed plan of care with patient verbalized understanding.  Labs/tests ordered: None   Next Appointment : 6 months for medical management fasting labs 2-4 days prior to visit   Caesar Bookman, NP

## 2020-04-15 ENCOUNTER — Other Ambulatory Visit: Payer: Self-pay | Admitting: Family

## 2020-04-17 ENCOUNTER — Other Ambulatory Visit: Payer: Self-pay | Admitting: *Deleted

## 2020-04-17 MED ORDER — ALPRAZOLAM 0.25 MG PO TABS: ORAL_TABLET | ORAL | 0 refills | Status: DC

## 2020-04-17 NOTE — Telephone Encounter (Signed)
Patient called and stated that [facility] never received Rx for Alprazolam.  Last Refill was March.  Pended Rx and sent to [facility] for approval.

## 2020-04-17 NOTE — Telephone Encounter (Signed)
Pharmacy requested refill Pended Rxs and sent to [facility] for approval due to HIGH ALERT Warning.

## 2020-04-20 ENCOUNTER — Other Ambulatory Visit: Payer: Self-pay | Admitting: Family

## 2020-04-20 DIAGNOSIS — E785 Hyperlipidemia, unspecified: Secondary | ICD-10-CM

## 2020-05-03 ENCOUNTER — Other Ambulatory Visit: Payer: Self-pay | Admitting: Family

## 2020-05-04 ENCOUNTER — Telehealth: Payer: Self-pay | Admitting: *Deleted

## 2020-05-04 ENCOUNTER — Other Ambulatory Visit: Payer: Self-pay | Admitting: *Deleted

## 2020-05-04 MED ORDER — ALPRAZOLAM 0.25 MG PO TABS: ORAL_TABLET | ORAL | 0 refills | Status: DC

## 2020-05-04 MED ORDER — ZOLPIDEM TARTRATE 10 MG PO TABS
10.0000 mg | ORAL_TABLET | Freq: Every day | ORAL | 0 refills | Status: DC
Start: 2020-05-04 — End: 2020-06-02

## 2020-05-04 NOTE — Telephone Encounter (Signed)
Patient called back and stated that she discussed with Dinah at her last visit about continuing to refill medication until she could get an appointment with psychiatry. Awaiting appointment. Stated they are booked out.  (reviewed last OV note dated 8/6 and note not completed)  Patient is requesting refill to be sent to pharmacy Please Advise.

## 2020-05-04 NOTE — Telephone Encounter (Signed)
Left message on voicemail for patient to return call when available    Per note on rx medication is prescribed by a psychiatrist, need to clarify.

## 2020-05-04 NOTE — Telephone Encounter (Signed)
Alprazolam e-scribed to pharmacy.

## 2020-05-04 NOTE — Telephone Encounter (Signed)
Patient called and stated that she is out of her Xanax for about 2 weeks now. Stated that the pharmacy will not give it to her until the 30th because the directions on the medication state to take Every Other Day and she takes it once daily.   Stated that Dinah told her to take One Daily at last appointment. (Reviewed 04/14/20 OV note, note is not completed.)  Patient is requesting the directions to be changed and Rx sent to pharmacy. Has not been able to get an appointment with Psychiatry yet.  Please Advise.

## 2020-05-04 NOTE — Telephone Encounter (Signed)
Received refill Request from pharmacy [facility]: 01/21/20 Needs contract signed. Added to future appointment.  Pended Rx and sent to [facility] for approval.

## 2020-05-04 NOTE — Telephone Encounter (Signed)
Rx corrected and medication list updated.  Pended Rx and sent to [facility] for approval.

## 2020-05-04 NOTE — Telephone Encounter (Signed)
May change Alprazolam 0.25 mg tablet to once daily

## 2020-05-16 ENCOUNTER — Ambulatory Visit: Payer: BC Managed Care – PPO | Admitting: Family

## 2020-05-26 ENCOUNTER — Other Ambulatory Visit: Payer: Self-pay | Admitting: Family

## 2020-05-30 ENCOUNTER — Other Ambulatory Visit: Payer: Self-pay | Admitting: Family

## 2020-06-01 DIAGNOSIS — M238X1 Other internal derangements of right knee: Secondary | ICD-10-CM | POA: Diagnosis not present

## 2020-06-01 DIAGNOSIS — M25561 Pain in right knee: Secondary | ICD-10-CM | POA: Diagnosis not present

## 2020-06-02 ENCOUNTER — Other Ambulatory Visit: Payer: Self-pay | Admitting: Family

## 2020-06-02 NOTE — Telephone Encounter (Signed)
RX last filled on 05/04/2020, no treatment agreement on file. Patient states Christina Leonard has ok'd to fill until she can get in with Psych

## 2020-06-02 NOTE — Telephone Encounter (Signed)
Patient aware Christina Leonard only approve 7 in the interim. Christina Leonard will have to review and approve a higher dispense number when she returns.  Patient was glad that Christina Leonard approved 7 to prevent her from running out and asked that we forward this message to Christina Leonard to further review on Monday when she returns

## 2020-06-06 ENCOUNTER — Ambulatory Visit: Payer: BC Managed Care – PPO | Admitting: Family

## 2020-06-10 ENCOUNTER — Other Ambulatory Visit: Payer: Self-pay | Admitting: Nurse Practitioner

## 2020-06-12 ENCOUNTER — Telehealth: Payer: Self-pay | Admitting: *Deleted

## 2020-06-12 MED ORDER — ZOLPIDEM TARTRATE 10 MG PO TABS
ORAL_TABLET | ORAL | 0 refills | Status: DC
Start: 2020-06-12 — End: 2020-07-10

## 2020-06-12 NOTE — Telephone Encounter (Signed)
If unable to be seen by Psychiatry next month will wean the Ambien down or attempt dosage reduction.

## 2020-06-12 NOTE — Telephone Encounter (Signed)
Patient notified and agreed. Stated that she will be calling the Psychiatrist this Friday and paying the $100.

## 2020-06-12 NOTE — Telephone Encounter (Signed)
Patient called and stated that she is only getting #7 of Ambien and she takes it everyday. Stated that she would like a month's worth sent to pharmacy instead of only #7.   Stated that she had an approval to a Psych. Rene Kocher, NP but she has to pay $100 up Front for the appointment date and she is going to get that paid.   Wanting a 30 day supply of Ambien.  Pended and sent to [facility] for approval.  Please Advise.

## 2020-06-13 DIAGNOSIS — M25562 Pain in left knee: Secondary | ICD-10-CM | POA: Diagnosis not present

## 2020-07-10 ENCOUNTER — Ambulatory Visit (INDEPENDENT_AMBULATORY_CARE_PROVIDER_SITE_OTHER): Payer: PRIVATE HEALTH INSURANCE | Admitting: Adult Health

## 2020-07-10 ENCOUNTER — Encounter: Payer: Self-pay | Admitting: Adult Health

## 2020-07-10 ENCOUNTER — Other Ambulatory Visit: Payer: Self-pay

## 2020-07-10 VITALS — BP 168/103 | HR 75 | Ht 63.0 in | Wt 158.0 lb

## 2020-07-10 DIAGNOSIS — F331 Major depressive disorder, recurrent, moderate: Secondary | ICD-10-CM | POA: Diagnosis not present

## 2020-07-10 DIAGNOSIS — F411 Generalized anxiety disorder: Secondary | ICD-10-CM

## 2020-07-10 DIAGNOSIS — G4700 Insomnia, unspecified: Secondary | ICD-10-CM

## 2020-07-10 DIAGNOSIS — F410 Panic disorder [episodic paroxysmal anxiety] without agoraphobia: Secondary | ICD-10-CM | POA: Diagnosis not present

## 2020-07-10 MED ORDER — CITALOPRAM HYDROBROMIDE 40 MG PO TABS: 40.0000 mg | ORAL_TABLET | Freq: Every day | ORAL | 1 refills | Status: DC

## 2020-07-10 MED ORDER — ZOLPIDEM TARTRATE 10 MG PO TABS: ORAL_TABLET | ORAL | 0 refills | Status: DC

## 2020-07-10 MED ORDER — ZOLPIDEM TARTRATE 10 MG PO TABS: ORAL_TABLET | ORAL | 2 refills | Status: DC

## 2020-07-10 MED ORDER — ALPRAZOLAM 0.25 MG PO TABS: ORAL_TABLET | ORAL | 2 refills | Status: DC

## 2020-07-10 NOTE — Progress Notes (Signed)
[facility] MD/PA/NP Initial Note  07/10/2020 5:28 PM Christina Leonard  MRN:  [id number]  Chief Complaint:   HPI:   Describes mood today as "ok". Pleasant. Mood symptoms - denies depression and irritability. Feels more anxious overall. Anxiety is more situational. Trying to meditate - doing affirmations. Doing what she can to suppress the negativity. Started Celexa 40mg  8 months ago and feels like it is helpful. Has taken Xanax for 3 to 4 years for panic attacks. Symptoms typically worse when talking to family on the phone. Stable interest and motivation - "it's ok, not great". Taking medications as prescribed.  Energy levels lower. Active, does not have a regular exercise routine - knee problems.  Enjoys some usual interests and activities. Married. Lives with husband x 25 years - 3 dog and 2 cats. Two daughters 79 and 56. Daughters are in 25. Moved to [place] from [place] 7 months ago for husband's job. Spending time with family. Appetite adequate. Weight gain 158 pounds. Sleeps well most nights. Averages 7 to 8 hours with Ambien. Focus and concentration difficulties at times - gets "side tracked" some times. Gets done what she needs to get done. Completing tasks. Managing aspects of household. Unemployed. Denies SI or HI.  Denies AH or VH.  One kidney x 12 years ago - working with PCP to manage BP.  Previous medication trials: Celexa 40mg  daily, Xanax, Ambien 10mg  at hs  Visit Diagnosis:    ICD-10-CM   1. Generalized anxiety disorder  F41.1 citalopram (CELEXA) 40 MG tablet  2. Major depressive disorder, recurrent episode, moderate (HCC)  F33.1 citalopram (CELEXA) 40 MG tablet  3. Panic attacks  F41.0 ALPRAZolam (XANAX) 0.25 MG tablet  4. Insomnia, unspecified type  G47.00 zolpidem (AMBIEN) 10 MG tablet    DISCONTINUED: zolpidem (AMBIEN) 10 MG tablet    Past Psychiatric History: Denies  Past Medical History:  Past Medical History:  Diagnosis Date  . Anxiety   . Arthritis   . Chronic  kidney disease 2008   Nephrectomy   . Depression   . High blood pressure   . High cholesterol   . Insomnia     Past Surgical History:  Procedure Laterality Date  . NEPHRECTOMY  2008   Done in  . VAGINAL HYSTERECTOMY  2010   Dr. 2009    Family Psychiatric History: Denies any family history of mental illness.  Family History:  Family History  Problem Relation Age of Onset  . Osteoporosis Mother   . Arthritis Mother     Social History:  Social History   Socioeconomic History  . Marital status: Married    Spouse name: Not on file  . Number of children: Not on file  . Years of education: Not on file  . Highest education level: Not on file  Occupational History  . Not on file  Tobacco Use  . Smoking status: Never Smoker  . Smokeless tobacco: Never Used  Substance and Sexual Activity  . Alcohol use: Yes    Alcohol/week: 2.0 standard drinks    Types: 2 Glasses of wine per week    Comment: Daily  . Drug use: Never  . Sexual activity: Not on file  Other Topics Concern  . Not on file  Social History Narrative   Tobacco use:  None   Alcohol use: 7 per week   Diet:  Low carb.    Do you drink/eat things with caffeine?  A cup of coffee a day   Marital status:  Married   Do you live in a house, apartment, assisted living, condo, trailer, etc.)?  House   Is it one or more stories?  One story    How many persons live in your home?  Two (patient stated one, but is married)   Do you have any pets in your home? (please list) Dogs and birds   Current or past profession:  Retired   Highest level of education:  High school   Do you exercise:  Occasionally - twice a week.       Advanced Directive   No living will   No DNR   No POA/HPOA      Functional status:   No difficulty bathing or dressing   No difficulty preparing food or eating   No difficulty managing medications   No difficulty managing finances   No difficulty affording medications.       Social Determinants of Health   Financial Resource Strain:   . Difficulty of Paying Living Expenses: Not on file  Food Insecurity:   . Worried About Programme researcher, broadcasting/film/video in the Last Year: Not on file  . Ran Out of Food in the Last Year: Not on file  Transportation Needs:   . Lack of Transportation (Medical): Not on file  . Lack of Transportation (Non-Medical): Not on file  Physical Activity:   . Days of Exercise per Week: Not on file  . Minutes of Exercise per Session: Not on file  Stress:   . Feeling of Stress : Not on file  Social Connections:   . Frequency of Communication with Friends and Family: Not on file  . Frequency of Social Gatherings with Friends and Family: Not on file  . Attends Religious Services: Not on file  . Active Member of Clubs or Organizations: Not on file  . Attends Banker Meetings: Not on file  . Marital Status: Not on file    Allergies:  Allergies  Allergen Reactions  . Benzocaine-Menthol   . Cholecalciferol   . Skin Adhesives [Cyanoacrylate]   . Codeine Other (See Comments)    Stomach problems patient reports  . Iodinated Diagnostic Agents Rash  . Penicillins Rash  . Sulfa Antibiotics Rash    Metabolic Disorder Labs: No results found for: HGBA1C, MPG No results found for: PROLACTIN Lab Results  Component Value Date   CHOL 210 (H) 01/21/2020   TRIG 151 (H) 01/21/2020   HDL 41 (L) 01/21/2020   CHOLHDL 5.1 (H) 01/21/2020   LDLCALC 141 (H) 01/21/2020   Lab Results  Component Value Date   TSH 1.85 01/21/2020    Therapeutic Level Labs: No results found for: LITHIUM No results found for: VALPROATE No components found for:  CBMZ  Current Medications: Current Outpatient Medications  Medication Sig Dispense Refill  . metoprolol succinate (TOPROL-XL) 25 MG 24 hr tablet TAKE 1 TABLET BY MOUTH EVERY DAY ALONG WITH THE 50MG  TO TOTAL 75MG  DAILY 90 tablet 1  . metoprolol succinate (TOPROL-XL) 50 MG 24 hr tablet 1 TABLET ONCE  DAILY ALONG WITH 25MG  TO TOTAL 75MG  DAILY. TAKE WITH OR IMMEDIATELY FOLLOWING A MEAL 90 tablet 1  . ALPRAZolam (XANAX) 0.25 MG tablet Take one tablet by mouth three times daily. 90 tablet 2  . citalopram (CELEXA) 40 MG tablet Take 1 tablet (40 mg total) by mouth daily. (prescribed by Psych) 90 tablet 1  . linaclotide (LINZESS) 72 MCG capsule Take 72 mcg by mouth as needed.    losartan (  COZAAR) 50 MG tablet Take 1.5 tablets (75 mg total) by mouth daily. 45 tablet 1  . MAGNESIUM BISGLYCINATE PO Take 1 capsule by mouth as needed.     . rosuvastatin (CRESTOR) 5 MG tablet TAKE 1 TABLET BY MOUTH EVERY DAY 90 tablet 1  . testosterone cypionate (DEPOTESTOSTERONE CYPIONATE) 200 MG/ML injection Inject 0.15 mg into the muscle every 14 (fourteen) days. No bottle shown for this medication    . Vitamin D, Ergocalciferol, (DRISDOL) 1.25 MG (50000 UNIT) CAPS capsule Take 5,000 Units by mouth daily.     Marland Kitchen zolpidem (AMBIEN) 10 MG tablet Take one tablet by mouth once daily at bedtime 30 tablet 2   No current facility-administered medications for this visit.    Medication Side Effects: none  Orders placed this visit:  No orders of the defined types were placed in this encounter.   Psychiatric Specialty Exam:  Review of Systems  Blood pressure (!) 168/103, pulse 75, height 5\' 3"  (1.6 m), weight 158 lb (71.7 kg).Body mass index is 27.99 kg/m.  General Appearance: Neat and Well Groomed  Eye Contact:  Good  Speech:  Clear and Coherent  Volume:  Normal  Mood:  Anxious  Affect:  Appropriate and Congruent  Thought Process:  Coherent and Descriptions of Associations: Intact  Orientation:  Full (Time, Place, and Person)  Thought Content: Logical   Suicidal Thoughts:  No  Homicidal Thoughts:  No  Memory:  WNL  Judgement:  Good  Insight:  Good  Psychomotor Activity:  Normal  Concentration:  Concentration: Good  Recall:  Good  Fund of Knowledge: Good  Language: Good  Assets:  Communication  Skills Desire for Improvement Financial Resources/Insurance Housing Intimacy Leisure Time Physical Health Resilience Social Support Talents/Skills Transportation Vocational/Educational  ADL's:  Intact  Cognition: WNL  Prognosis:  Good   Screenings: MDQ  Receiving Psychotherapy: No   Treatment Plan/Recommendations:   Plan:  PDMP reviewed  1. Celexa 40mg  daily 2. Ambien 10mg  daily 3. Increase Xanax 0.25mg  daily to three times daily  Read and reviewed note with patient for accuracy.   RTC 4 weeks  Patient advised to contact office with any questions, adverse effects, or acute worsening in signs and symptoms.    , NP

## 2020-07-17 DIAGNOSIS — Z01818 Encounter for other preprocedural examination: Secondary | ICD-10-CM | POA: Diagnosis not present

## 2020-07-18 ENCOUNTER — Telehealth: Payer: Self-pay | Admitting: *Deleted

## 2020-07-18 NOTE — Telephone Encounter (Signed)
Patient called and wanted to clarify the dosage of her Losartan. Current medication list stated to take Losartan 50mg  One and a half tablet (75mg ) once daily.  Patient agreed.

## 2020-07-21 ENCOUNTER — Other Ambulatory Visit: Payer: BC Managed Care – PPO

## 2020-07-25 ENCOUNTER — Ambulatory Visit: Payer: BC Managed Care – PPO | Admitting: Family

## 2020-07-25 DIAGNOSIS — L258 Unspecified contact dermatitis due to other agents: Secondary | ICD-10-CM | POA: Diagnosis not present

## 2020-07-31 ENCOUNTER — Ambulatory Visit (INDEPENDENT_AMBULATORY_CARE_PROVIDER_SITE_OTHER): Payer: BC Managed Care – PPO | Admitting: Family

## 2020-07-31 ENCOUNTER — Other Ambulatory Visit: Payer: BC Managed Care – PPO

## 2020-07-31 ENCOUNTER — Encounter: Payer: Self-pay | Admitting: Family

## 2020-07-31 ENCOUNTER — Other Ambulatory Visit: Payer: Self-pay

## 2020-07-31 VITALS — BP 110/80 | HR 75 | Temp 97.8°F | Resp 16 | Ht 63.0 in | Wt 155.0 lb

## 2020-07-31 DIAGNOSIS — I10 Essential (primary) hypertension: Secondary | ICD-10-CM | POA: Diagnosis not present

## 2020-07-31 DIAGNOSIS — R21 Rash and other nonspecific skin eruption: Secondary | ICD-10-CM

## 2020-07-31 DIAGNOSIS — E785 Hyperlipidemia, unspecified: Secondary | ICD-10-CM | POA: Diagnosis not present

## 2020-07-31 MED ORDER — TRIAMCINOLONE ACETONIDE 0.1 % EX CREA: 1.0000 "application " | TOPICAL_CREAM | Freq: Two times a day (BID) | CUTANEOUS | 0 refills | Status: DC

## 2020-07-31 MED ORDER — HYDROXYZINE HCL 10 MG PO TABS: 10.0000 mg | ORAL_TABLET | Freq: Three times a day (TID) | ORAL | 0 refills | Status: DC | PRN

## 2020-07-31 NOTE — Progress Notes (Signed)
Provider: Richarda Blade FNP-C  Ellington Cornia, Donalee Citrin, NP  Patient Care Team: Jaella Weinert, Donalee Citrin, NP as PCP - General (Family Medicine)  Extended Emergency Contact Information Primary Emergency Contact: aliese, brannum Mobile Phone: [phone] Relation: Spouse  Code Status:  DNR Goals of care: Advanced Directive information Advanced Directives 07/31/2020  Does Patient Have a Medical Advance Directive? No  Would patient like information on creating a medical advance directive? No - Patient declined     Chief Complaint  Patient presents with  . Acute Visit    Allergic Reaction.    HPI:  Pt is a 49 y.o. female seen today for an acute visit for evaluation of rash on upper abdomen and below the breast.she states had breast implants and lipo suction done by Dr.Bowers on 07/20/2020.thinks she is allergic to the scrub solution that was used to cleanse her skin prior to procedure.she was seen by dermatologist course of prednisone was given x 5 days and steroid cream prescribed.she has completed prednisone and used up the cream which she states seemed to have helped but rash is back again.Has been itching all over her body including her right ear.Has also taken Benadryl without any relief." Itching is making me crazy". She request another cream and a pill to take for the itchy.she tried to get  Pharmacy  to refill her steroid cream but was told it was too soon. She denies any fever,chills,nausea or vomiting.No recent change in her laundry soap or lotion. She was instructed to wear a Abdominal brace after her procedure has not been able to wear.States brace makes her hot then she tends to itchy more.  She was treated with Diflucan,Doxyxcyline and currently on Lovenox for DVT prevention.     Past Medical History:  Diagnosis Date  . Anxiety   . Arthritis   . Chronic kidney disease 2008   Nephrectomy   . Depression   . High blood pressure   . High cholesterol   . Insomnia    Past Surgical  History:  Procedure Laterality Date  . NEPHRECTOMY  2008   Done in [place]  . VAGINAL HYSTERECTOMY  2010   Dr. Ernie Hew    Allergies  Allergen Reactions  . Benzocaine-Menthol   . Cholecalciferol   . Skin Adhesives [Cyanoacrylate]   . Chlorhexidine Rash and Hives  . Codeine Other (See Comments)    Stomach problems patient reports  . Iodinated Diagnostic Agents Rash  . Penicillins Rash  . Sulfa Antibiotics Rash    Outpatient Encounter Medications as of 07/31/2020  Medication Sig  . ALPRAZolam (XANAX) 0.25 MG tablet Take one tablet by mouth three times daily.  . citalopram (CELEXA) 40 MG tablet Take 1 tablet (40 mg total) by mouth daily. (prescribed by Psych)  . fluticasone (CUTIVATE) 0.05 % cream Apply 1 application topically 2 (two) times daily.  Marland Kitchen linaclotide (LINZESS) 72 MCG capsule Take 72 mcg by mouth as needed.  Marland Kitchen losartan (COZAAR) 50 MG tablet Take 1.5 tablets (75 mg total) by mouth daily.  Marland Kitchen MAGNESIUM BISGLYCINATE PO Take 1 capsule by mouth as needed.   . metoprolol succinate (TOPROL-XL) 25 MG 24 hr tablet TAKE 1 TABLET BY MOUTH EVERY DAY ALONG WITH THE 50MG  TO TOTAL 75MG  DAILY  . metoprolol succinate (TOPROL-XL) 50 MG 24 hr tablet 1 TABLET ONCE DAILY ALONG WITH 25MG  TO TOTAL 75MG  DAILY. TAKE WITH OR IMMEDIATELY FOLLOWING A MEAL  . rosuvastatin (CRESTOR) 5 MG tablet TAKE 1 TABLET BY MOUTH EVERY DAY  . testosterone  cypionate (DEPOTESTOSTERONE CYPIONATE) 200 MG/ML injection Inject 0.15 mg into the muscle every 14 (fourteen) days. No bottle shown for this medication  . Vitamin D, Ergocalciferol, (DRISDOL) 1.25 MG (50000 UNIT) CAPS capsule Take 5,000 Units by mouth daily.   Marland Kitchen zolpidem (AMBIEN) 10 MG tablet Take one tablet by mouth once daily at bedtime   No facility-administered encounter medications on file as of 07/31/2020.    Review of Systems  Respiratory: Negative for cough, chest tightness, shortness of breath and wheezing.   Cardiovascular: Negative for chest  pain, palpitations and leg swelling.  Gastrointestinal: Negative for abdominal distention, abdominal pain, constipation, diarrhea, nausea and vomiting.  Genitourinary: Negative for difficulty urinating, flank pain, frequency, hematuria and urgency.  Musculoskeletal: Negative for arthralgias, back pain, gait problem, joint swelling and myalgias.  Skin: Positive for rash. Negative for color change and pallor.       Generalized itching.Dr. Odis Luster   Neurological: Negative for dizziness, speech difficulty, weakness, light-headedness, numbness and headaches.  Psychiatric/Behavioral: Negative for agitation, behavioral problems and sleep disturbance. The patient is not nervous/anxious.     Immunization History  Administered Date(s) Administered  . PFIZER SARS-COV-2 Vaccination 05/26/2020   Pertinent  Health Maintenance Due  Topic Date Due  . PAP SMEAR-Modifier  Never done  . INFLUENZA VACCINE  Never done   Fall Risk  07/31/2020 04/14/2020 01/19/2020  Falls in the past year? 0 0 0  Number falls in past yr: 0 0 0  Injury with Fall? 0 0 0   Functional Status Survey:    Vitals:   07/31/20 0952  BP: 110/80  Pulse: 75  Resp: 16  Temp: 97.8 F (36.6 C)  SpO2: 99%  Weight: 155 lb (70.3 kg)  Height: 5\' 3"  (1.6 m)   Body mass index is 27.46 kg/m. Physical Exam Vitals reviewed.  Constitutional:      General: She is not in acute distress.    Appearance: She is not ill-appearing.  Eyes:     General: No scleral icterus.       Right eye: No discharge.        Left eye: No discharge.     Extraocular Movements: Extraocular movements intact.     Conjunctiva/sclera: Conjunctivae normal.     Pupils: Pupils are equal, round, and reactive to light.  Cardiovascular:     Rate and Rhythm: Normal rate and regular rhythm.     Pulses: Normal pulses.     Heart sounds: Normal heart sounds. No murmur heard.  No friction rub. No gallop.   Pulmonary:     Effort: Pulmonary effort is normal. No  respiratory distress.     Breath sounds: Normal breath sounds. No wheezing, rhonchi or rales.  Chest:     Chest wall: No tenderness.  Abdominal:     General: Bowel sounds are normal. There is no distension.     Palpations: Abdomen is soft. There is no mass.     Tenderness: There is no abdominal tenderness. There is no right CVA tenderness, left CVA tenderness, guarding or rebound.  Musculoskeletal:        General: No swelling or tenderness. Normal range of motion.     Right lower leg: No edema.     Left lower leg: No edema.  Skin:    General: Skin is warm and dry.     Coloration: Skin is not pale.     Findings: Erythema and rash present. Rash is not crusting.  Comments: Non-raised itchy rash  Bilateral breast fold incision dry,clean and intact without any signs of infection.   Neurological:     Mental Status: She is alert and oriented to person, place, and time.     Cranial Nerves: No cranial nerve deficit.     Sensory: No sensory deficit.     Motor: No weakness.     Gait: Gait normal.  Psychiatric:        Mood and Affect: Mood normal.        Behavior: Behavior normal.        Thought Content: Thought content normal.        Judgment: Judgment normal.     Labs reviewed: Recent Labs    01/21/20 0816  NA 137  K 4.3  CL 105  CO2 27  GLUCOSE 90  BUN 9  CREATININE 0.76  CALCIUM 9.2  MG 1.7   Recent Labs    01/21/20 0816  AST 12  ALT 10  BILITOT 1.3*  PROT 6.6   No results for input(s): WBC, NEUTROABS, HGB, HCT, MCV, PLT in the last 8760 hours. Lab Results  Component Value Date   TSH 1.85 01/21/2020   No results found for: HGBA1C Lab Results  Component Value Date   CHOL 210 (H) 01/21/2020   HDL 41 (L) 01/21/2020   LDLCALC 141 (H) 01/21/2020   TRIG 151 (H) 01/21/2020   CHOLHDL 5.1 (H) 01/21/2020    Significant Diagnostic Results in last 30 days:  No results found.  Assessment/Plan   Rash and nonspecific skin eruption Afebrile. Bilateral  upper abdomen erythema itch non raised rash.suspected for skin cleanser used prior to procedure. - Advised to take Hydroxyzine three times as needed for itching. - apply Triamcinolone cream to affected areas twice daily.  - advised to apply cool compressor at least 10-15 minutes daily and PRN to help with the itching.  - hydrOXYzine (ATARAX/VISTARIL) 10 MG tablet; Take 1 tablet (10 mg total) by mouth 3 (three) times daily as needed for itching.  Dispense: 30 tablet; Refill: 0 - triamcinolone (KENALOG) 0.1 %; Apply 1 application topically 2 (two) times daily.  Dispense: 30 g; Refill: 0 - Has follow up appointment for routine visit 08/02/2020  - follow up at the urgent care or ED if symptoms worsen.   Family/ staff Communication: Reviewed plan of care with patient  Labs/tests ordered: None   Next Appointment: Has appointment 08/02/2020   Caesar Bookman, NP

## 2020-08-01 LAB — CBC WITH DIFFERENTIAL/PLATELET
Absolute Monocytes: 788 cells/uL (ref 200–950)
Basophils Absolute: 75 cells/uL (ref 0–200)
Basophils Relative: 0.6 %
Eosinophils Absolute: 663 cells/uL — ABNORMAL HIGH (ref 15–500)
Eosinophils Relative: 5.3 %
HCT: 47.5 % — ABNORMAL HIGH (ref 35.0–45.0)
Hemoglobin: 15.8 g/dL — ABNORMAL HIGH (ref 11.7–15.5)
Lymphs Abs: 3850 cells/uL (ref 850–3900)
MCH: 30.5 pg (ref 27.0–33.0)
MCHC: 33.3 g/dL (ref 32.0–36.0)
MCV: 91.7 fL (ref 80.0–100.0)
MPV: 10.3 fL (ref 7.5–12.5)
Monocytes Relative: 6.3 %
Neutro Abs: 7125 cells/uL (ref 1500–7800)
Neutrophils Relative %: 57 %
Platelets: 393 10*3/uL (ref 140–400)
RBC: 5.18 10*6/uL — ABNORMAL HIGH (ref 3.80–5.10)
RDW: 12.2 % (ref 11.0–15.0)
Total Lymphocyte: 30.8 %
WBC: 12.5 10*3/uL — ABNORMAL HIGH (ref 3.8–10.8)

## 2020-08-01 LAB — COMPLETE METABOLIC PANEL WITH GFR
AG Ratio: 1.3 (calc) (ref 1.0–2.5)
ALT: 14 U/L (ref 6–29)
AST: 15 U/L (ref 10–35)
Albumin: 3.9 g/dL (ref 3.6–5.1)
Alkaline phosphatase (APISO): 41 U/L (ref 31–125)
BUN: 13 mg/dL (ref 7–25)
CO2: 24 mmol/L (ref 20–32)
Calcium: 9.3 mg/dL (ref 8.6–10.2)
Chloride: 106 mmol/L (ref 98–110)
Creat: 0.95 mg/dL (ref 0.50–1.10)
GFR, Est African American: 82 mL/min/{1.73_m2} (ref >=60)
GFR, Est Non African American: 70 mL/min/{1.73_m2} (ref >=60)
Globulin: 2.9 g/dL (calc) (ref 1.9–3.7)
Glucose, Bld: 85 mg/dL (ref 65–99)
Potassium: 4.6 mmol/L (ref 3.5–5.3)
Sodium: 140 mmol/L (ref 135–146)
Total Bilirubin: 0.7 mg/dL (ref 0.2–1.2)
Total Protein: 6.8 g/dL (ref 6.1–8.1)

## 2020-08-01 LAB — LIPID PANEL
Cholesterol: 157 mg/dL (ref <200)
HDL: 38 mg/dL — ABNORMAL LOW (ref >=50)
LDL Cholesterol (Calc): 88 mg/dL (calc)
Non-HDL Cholesterol (Calc): 119 mg/dL (calc) (ref <130)
Total CHOL/HDL Ratio: 4.1 (calc) (ref <5.0)
Triglycerides: 214 mg/dL — ABNORMAL HIGH (ref <150)

## 2020-08-01 LAB — TSH: TSH: 1.66 mIU/L

## 2020-08-02 ENCOUNTER — Encounter: Payer: Self-pay | Admitting: Family

## 2020-08-02 ENCOUNTER — Ambulatory Visit (INDEPENDENT_AMBULATORY_CARE_PROVIDER_SITE_OTHER): Payer: BC Managed Care – PPO | Admitting: Family

## 2020-08-02 ENCOUNTER — Other Ambulatory Visit: Payer: Self-pay

## 2020-08-02 VITALS — BP 126/84 | HR 85 | Temp 97.7°F | Resp 16 | Ht 63.0 in | Wt 154.8 lb

## 2020-08-02 DIAGNOSIS — E785 Hyperlipidemia, unspecified: Secondary | ICD-10-CM | POA: Diagnosis not present

## 2020-08-02 DIAGNOSIS — I10 Essential (primary) hypertension: Secondary | ICD-10-CM | POA: Diagnosis not present

## 2020-08-02 DIAGNOSIS — D72828 Other elevated white blood cell count: Secondary | ICD-10-CM

## 2020-08-02 DIAGNOSIS — M8949 Other hypertrophic osteoarthropathy, multiple sites: Secondary | ICD-10-CM

## 2020-08-02 DIAGNOSIS — F411 Generalized anxiety disorder: Secondary | ICD-10-CM | POA: Diagnosis not present

## 2020-08-02 DIAGNOSIS — E559 Vitamin D deficiency, unspecified: Secondary | ICD-10-CM

## 2020-08-02 DIAGNOSIS — E781 Pure hyperglyceridemia: Secondary | ICD-10-CM

## 2020-08-02 DIAGNOSIS — F5101 Primary insomnia: Secondary | ICD-10-CM

## 2020-08-02 DIAGNOSIS — F321 Major depressive disorder, single episode, moderate: Secondary | ICD-10-CM

## 2020-08-02 DIAGNOSIS — M159 Polyosteoarthritis, unspecified: Secondary | ICD-10-CM

## 2020-08-02 LAB — CBC WITH DIFFERENTIAL/PLATELET
Absolute Monocytes: 754 cells/uL (ref 200–950)
Basophils Absolute: 74 cells/uL (ref 0–200)
Basophils Relative: 0.8 %
Eosinophils Absolute: 570 cells/uL — ABNORMAL HIGH (ref 15–500)
Eosinophils Relative: 6.2 %
HCT: 47.3 % — ABNORMAL HIGH (ref 35.0–45.0)
Hemoglobin: 15.9 g/dL — ABNORMAL HIGH (ref 11.7–15.5)
Lymphs Abs: 3110 cells/uL (ref 850–3900)
MCH: 30.2 pg (ref 27.0–33.0)
MCHC: 33.6 g/dL (ref 32.0–36.0)
MCV: 89.8 fL (ref 80.0–100.0)
MPV: 10.8 fL (ref 7.5–12.5)
Monocytes Relative: 8.2 %
Neutro Abs: 4692 cells/uL (ref 1500–7800)
Neutrophils Relative %: 51 %
Platelets: 388 10*3/uL (ref 140–400)
RBC: 5.27 10*6/uL — ABNORMAL HIGH (ref 3.80–5.10)
RDW: 12 % (ref 11.0–15.0)
Total Lymphocyte: 33.8 %
WBC: 9.2 10*3/uL (ref 3.8–10.8)

## 2020-08-02 MED ORDER — FENOFIBRATE 145 MG PO TABS: 145.0000 mg | ORAL_TABLET | Freq: Every day | ORAL | 3 refills | Status: DC

## 2020-08-02 NOTE — Progress Notes (Signed)
Provider: Marlowe Sax FNP-C   Safir Michalec, Nelda Bucks, NP  Patient Care Team: Devina Bezold, Nelda Bucks, NP as PCP - General (Family Medicine)  Extended Emergency Contact Information Primary Emergency Contact: rissa, turley Mobile Phone: [phone] Relation: Spouse  Code Status:  Full Code  Goals of care: Advanced Directive information Advanced Directives 07/31/2020  Does Patient Have a Medical Advance Directive? No  Would patient like information on creating a medical advance directive? No - Patient declined     Chief Complaint  Patient presents with  . Medical Management of Chronic Issues    6 Month Follow up    HPI:  Pt is a 49 y.o. female seen today for  6 months follow up for medical management of chronic diseases.she was here tow days ago for acute itchy rash after breast implant and liposuction.suspected possible reaction to prep scrub solution used during procedure.she had taken Benadryl,Steroid cream and 5 day  course prednisone prescribed by dermatologist without any relief.Hydroxyzine prescribed when she was here 07/31/2020.she states itching and rash has improved now able to wear her abdominal brace as ordered by specialist.Has not required any Alprazolam while on Hydroxyzine.  She had fasting lab work done recently.discussed results with her this visit.Her WBC 12.5 suspect possible due to reactive post breast implant and liposuction.she was treated with oral antibiotics Doxycycline prescribed by surgeon.States antibiotics was discontinued when   Hyperlipidemia -  Total cholesterol and LDL has improved compared to previous level.Triglycerides still high 214 previous 151 currently on rosuvastatin 5 mg tablet daily.she states eating salads  And trying to cut down on white rice. She is concerned that she eats well but still does not feel satisfy or full so tends to eat more often. Discussed eating high vegetable diet and snacking on veggies or low carbohydrates fruits. Encouraged to  exercise at least three times daily x 30 minutes after recovering from her recent liposuction procedure.she looking forward to joinig exercise aquatic aerobic classes   Hypertension - brought home B/p log readings in the 120's/90's - 140's / 90 currently on metoprolol succinate 75 mg tablet daily and Losartan 75 mg tablet daily .she denies any headache,dizziness,palpitation or chest pain.  Depression - on Celexa 40 mg tablet daily.Has established care with psychiatrist and was referred to see a counselor.Has upcoming appointment.  Insomnia - on zolpidem 10 mg tablet  At bedtime.managed by Psychiatrist Deloria Lair NP.      Past Medical History:  Diagnosis Date  . Anxiety   . Arthritis   . Chronic kidney disease 2008   Nephrectomy   . Depression   . High blood pressure   . High cholesterol   . Insomnia    Past Surgical History:  Procedure Laterality Date  . NEPHRECTOMY  2008   Done in [place]  . VAGINAL HYSTERECTOMY  2010   Dr. Lucia Bitter    Allergies  Allergen Reactions  . Benzocaine-Menthol   . Cholecalciferol   . Skin Adhesives [Cyanoacrylate]   . Chlorhexidine Rash and Hives  . Codeine Other (See Comments)    Stomach problems patient reports  . Iodinated Diagnostic Agents Rash  . Penicillins Rash  . Sulfa Antibiotics Rash    Allergies as of 08/02/2020      Reactions   Benzocaine-menthol    Cholecalciferol    Skin Adhesives [cyanoacrylate]    Chlorhexidine Rash, Hives   Codeine Other (See Comments)   Stomach problems patient reports   Iodinated Diagnostic Agents Rash   Penicillins Rash   Sulfa  Antibiotics Rash      Medication List       Accurate as of August 02, 2020  1:12 PM. If you have any questions, ask your nurse or doctor.        STOP taking these medications   Linzess 72 MCG capsule Generic drug: linaclotide Stopped by: Sandrea Hughs, NP   MAGNESIUM BISGLYCINATE PO Stopped by: Sandrea Hughs, NP   Vitamin D (Ergocalciferol) 1.25  MG (50000 UNIT) Caps capsule Commonly known as: DRISDOL Stopped by: Sandrea Hughs, NP     TAKE these medications   ALPRAZolam 0.25 MG tablet Commonly known as: XANAX Take one tablet by mouth three times daily.   citalopram 40 MG tablet Commonly known as: CELEXA Take 1 tablet (40 mg total) by mouth daily. (prescribed by Psych)   fluticasone 0.05 % cream Commonly known as: CUTIVATE Apply 1 application topically 2 (two) times daily.   hydrOXYzine 10 MG tablet Commonly known as: ATARAX/VISTARIL Take 1 tablet (10 mg total) by mouth 3 (three) times daily as needed for itching.   losartan 50 MG tablet Commonly known as: COZAAR Take 1.5 tablets (75 mg total) by mouth daily.   metoprolol succinate 50 MG 24 hr tablet Commonly known as: TOPROL-XL 1 TABLET ONCE DAILY ALONG WITH $RemoveBe'25MG'mWOUYfvpN$  TO TOTAL $Remove'75MG'HZQfudO$  DAILY. TAKE WITH OR IMMEDIATELY FOLLOWING A MEAL   metoprolol succinate 25 MG 24 hr tablet Commonly known as: TOPROL-XL TAKE 1 TABLET BY MOUTH EVERY DAY ALONG WITH THE $Remove'50MG'FhaNjns$  TO TOTAL $Remove'75MG'hHewJBw$  DAILY   rosuvastatin 5 MG tablet Commonly known as: CRESTOR TAKE 1 TABLET BY MOUTH EVERY DAY   testosterone cypionate 200 MG/ML injection Commonly known as: DEPOTESTOSTERONE CYPIONATE Inject 0.15 mg into the muscle every 14 (fourteen) days. No bottle shown for this medication   triamcinolone 0.1 % Commonly known as: KENALOG Apply 1 application topically 2 (two) times daily.   zolpidem 10 MG tablet Commonly known as: AMBIEN Take one tablet by mouth once daily at bedtime       Review of Systems  Constitutional: Negative for appetite change, chills, fatigue and fever.  HENT: Negative for congestion, postnasal drip, rhinorrhea, sinus pressure, sinus pain, sneezing, sore throat and trouble swallowing.   Eyes: Negative for pain, discharge, redness, itching and visual disturbance.  Respiratory: Negative for cough, chest tightness, shortness of breath and wheezing.   Cardiovascular: Negative for chest  pain, palpitations and leg swelling.  Gastrointestinal: Negative for abdominal distention, abdominal pain, constipation, diarrhea, nausea and vomiting.  Endocrine: Negative for cold intolerance, heat intolerance, polydipsia, polyphagia and polyuria.  Genitourinary: Negative for difficulty urinating, dysuria, flank pain, frequency and urgency.  Musculoskeletal: Negative for arthralgias, gait problem and joint swelling.  Skin: Negative for color change, pallor and rash.  Neurological: Negative for dizziness, speech difficulty, weakness, light-headedness, numbness and headaches.  Hematological: Does not bruise/bleed easily.  Psychiatric/Behavioral: Negative for behavioral problems and sleep disturbance. The patient is nervous/anxious.        [facility] referred to counselor      Immunization History  Administered Date(s) Administered  . PFIZER SARS-COV-2 Vaccination 05/26/2020   Pertinent  Health Maintenance Due  Topic Date Due  . PAP SMEAR-Modifier  Never done  . INFLUENZA VACCINE  Never done   Fall Risk  07/31/2020 04/14/2020 01/19/2020  Falls in the past year? 0 0 0  Number falls in past yr: 0 0 0  Injury with Fall? 0 0 0   Functional Status Survey:    Vitals:  08/02/20 1258  BP: 126/84  Pulse: 85  Resp: 16  Temp: 97.7 F (36.5 C)  TempSrc: Oral  SpO2: 99%  Weight: 154 lb 12.8 oz (70.2 kg)  Height: $Remove'5\' 3"'dgNWVJn$  (1.6 m)   Body mass index is 27.42 kg/m. Physical Exam Vitals reviewed.  Constitutional:      General: She is not in acute distress.    Appearance: She is overweight. She is not ill-appearing.  HENT:     Head: Normocephalic.     Nose: Nose normal. No congestion or rhinorrhea.     Mouth/Throat:     Mouth: Mucous membranes are moist.     Pharynx: Oropharynx is clear. No oropharyngeal exudate or posterior oropharyngeal erythema.  Eyes:     General: No scleral icterus.       Right eye: No discharge.        Left eye: No discharge.     Conjunctiva/sclera:  Conjunctivae normal.     Pupils: Pupils are equal, round, and reactive to light.  Neck:     Vascular: No carotid bruit.  Cardiovascular:     Rate and Rhythm: Normal rate and regular rhythm.     Pulses: Normal pulses.     Heart sounds: Normal heart sounds. No murmur heard.  No friction rub. No gallop.   Pulmonary:     Effort: Pulmonary effort is normal. No respiratory distress.     Breath sounds: Normal breath sounds. No wheezing, rhonchi or rales.  Chest:     Chest wall: No tenderness.  Abdominal:     General: Bowel sounds are normal. There is no distension.     Palpations: Abdomen is soft. There is no mass.     Tenderness: There is no abdominal tenderness. There is no right CVA tenderness, left CVA tenderness, guarding or rebound.  Musculoskeletal:        General: No swelling or tenderness. Normal range of motion.     Cervical back: Normal range of motion. No rigidity or tenderness.     Right lower leg: No edema.     Left lower leg: No edema.  Lymphadenopathy:     Cervical: No cervical adenopathy.  Skin:    General: Skin is warm and dry.     Coloration: Skin is not pale.     Findings: No bruising or erythema.     Comments: Previous bilateral upper quadrant rash redness has improved. Bilateral breast fold surgical incision healed without any redness or drainage.   Neurological:     Mental Status: She is alert and oriented to person, place, and time.     Cranial Nerves: No cranial nerve deficit.     Sensory: No sensory deficit.     Motor: No weakness.     Coordination: Coordination normal.     Gait: Gait normal.  Psychiatric:        Mood and Affect: Mood normal.        Behavior: Behavior normal.        Thought Content: Thought content normal.        Judgment: Judgment normal.     Labs reviewed: Recent Labs    01/21/20 0816 07/31/20 0823  NA 137 140  K 4.3 4.6  CL 105 106  CO2 27 24  GLUCOSE 90 85  BUN 9 13  CREATININE 0.76 0.95  CALCIUM 9.2 9.3  MG 1.7  --     Recent Labs    01/21/20 0816 07/31/20 0823  AST 12 15  ALT 10  14  BILITOT 1.3* 0.7  PROT 6.6 6.8   Recent Labs    07/31/20 0823  WBC 12.5*  NEUTROABS 7,125  HGB 15.8*  HCT 47.5*  MCV 91.7  PLT 393   Lab Results  Component Value Date   TSH 1.66 07/31/2020   No results found for: HGBA1C Lab Results  Component Value Date   CHOL 157 07/31/2020   HDL 38 (L) 07/31/2020   LDLCALC 88 07/31/2020   TRIG 214 (H) 07/31/2020   CHOLHDL 4.1 07/31/2020    Significant Diagnostic Results in last 30 days:  No results found.  Assessment/Plan 1. Essential hypertension B/p well controlled.continue on Metoprolol succinate and losartan. - continue to monitor B/p at home daily.  - CBC with Differential/Platelet; Future - CMP with eGFR([facility]); Future - TSH; Future  2. Hyperlipidemia LDL goal <100 Total cholesterol and LDL at goal. - continue on Crestor 5 mg tablet daily  - Lipid panel; Future  3. Generalized anxiety disorder Continue on alprazolam.  4. Current moderate episode of major depressive disorder, unspecified whether recurrent (HCC) Mood stable. - continue on Citalopram  - continue to follow up with Psychiatrist.   5. Primary insomnia Continue on Zolpidem managed by Psychiatrist  6. Primary osteoarthritis involving multiple joints Continue to follow up with Orthopedic   7. Vitamin D deficiency Continue on Vit D supplement.  8. Hypertriglyceridemia Recent TRG 214  Discussed dietary modification and exercise. Request medication for high TRG.Recommended Omega -3 but states allergic to shell fish so afraid might be allergic to Omega-3 fatty acid.Start on Fenofibrate as below.SE discussed. - fenofibrate (TRICOR) 145 MG tablet; Take 1 tablet (145 mg total) by mouth daily.  Dispense: 30 tablet; Refill: 3  9. Other elevated white blood cell (WBC) count Recent WBC 12.5 possible reactive.Afebrile.bilateral breast implant surgical incision are healed without any signs  of infection.recent itchy rash on upper ABD has improved.Will recheck labs. Continue to monitor trend.  - CBC with Differential/Platelet  Family/ staff Communication: Reviewed plan of care with patient  Labs/tests ordered:   - CBC with Differential/Platelet; Future - CMP with eGFR([facility]); Future - TSH; Future - Lipid panel; Future - CBC with Differential/Platelet  Next Appointment : 6 months for medical management of chronic issues   Sandrea Hughs, NP

## 2020-08-07 ENCOUNTER — Ambulatory Visit: Payer: PRIVATE HEALTH INSURANCE | Admitting: Adult Health

## 2020-08-07 ENCOUNTER — Telehealth: Payer: Self-pay

## 2020-08-07 ENCOUNTER — Encounter: Payer: Self-pay | Admitting: Adult Health

## 2020-08-07 ENCOUNTER — Other Ambulatory Visit: Payer: Self-pay

## 2020-08-07 ENCOUNTER — Telehealth: Payer: Self-pay | Admitting: Family

## 2020-08-07 ENCOUNTER — Ambulatory Visit (INDEPENDENT_AMBULATORY_CARE_PROVIDER_SITE_OTHER): Payer: PRIVATE HEALTH INSURANCE | Admitting: Adult Health

## 2020-08-07 DIAGNOSIS — F410 Panic disorder [episodic paroxysmal anxiety] without agoraphobia: Secondary | ICD-10-CM | POA: Diagnosis not present

## 2020-08-07 DIAGNOSIS — F909 Attention-deficit hyperactivity disorder, unspecified type: Secondary | ICD-10-CM

## 2020-08-07 DIAGNOSIS — F331 Major depressive disorder, recurrent, moderate: Secondary | ICD-10-CM

## 2020-08-07 DIAGNOSIS — F411 Generalized anxiety disorder: Secondary | ICD-10-CM

## 2020-08-07 DIAGNOSIS — G4700 Insomnia, unspecified: Secondary | ICD-10-CM | POA: Diagnosis not present

## 2020-08-07 MED ORDER — HYDROXYZINE HCL 10 MG PO TABS: ORAL_TABLET | ORAL | 2 refills | Status: DC

## 2020-08-07 MED ORDER — LISDEXAMFETAMINE DIMESYLATE 30 MG PO CAPS: 30.0000 mg | ORAL_CAPSULE | Freq: Every day | ORAL | 0 refills | Status: DC

## 2020-08-07 NOTE — Telephone Encounter (Signed)
Please see previous message.patient called earlier and in recommended to follow up with dermatologist as soon as possible.

## 2020-08-07 NOTE — Telephone Encounter (Signed)
Pt called and said her itching is spreading all over her body and she would like something stronger than what she has.  Please call her back at [phone]

## 2020-08-07 NOTE — Telephone Encounter (Signed)
Noted  

## 2020-08-07 NOTE — Progress Notes (Signed)
Christina Leonard [id number] [birth year] 49 y.o.  Subjective:   Patient ID:  Christina Leonard is a 49 y.o. (DOB 1971-06-20) female.  Chief Complaint: No chief complaint on file.   HPI Christina Leonard presents to the office today for follow-up of MDD, GAD, insomnia, and panic attacks.  Describes mood today as "ok". Pleasant. Mood symptoms - denies depression. Feels more irritable and anxious - feels like it's related to a recent surgery. Developed rash from chlorhexidine and is still having outbreaks. Concerned about focus issues and would like help to manage symptoms. Stating "it's a big problem for me". Starting tasks and not finishing them. Not getting things done in a timely manner. Varying interest and motivation. Taking medications as prescribed.  Energy levels lower. Active, does not have a regular exercise routine. Enjoys some usual interests and activities. Married. Lives with husband x 25 years - 3 dog and 2 cats. Two daughters 11 and 93. Daughters are in [place]. Moved to [place] from [place] 7 months ago for husband's job. Spending time with family. Appetite adequate. Weight gain 155 pounds. Sleeps well most nights. Averages 7 to 8 hours with Ambien. Focus and concentration difficulties at times - gets "side tracked". Stating "things have never been easy for me". Completing tasks. Managing aspects of household. Unemployed. Denies SI or HI.  Denies AH or VH.  127/85/75   One kidney x 12 years ago - working with PCP to manage BP.   Review of Systems:  Review of Systems  Musculoskeletal: Negative for gait problem.  Neurological: Negative for tremors.  Psychiatric/Behavioral:       Please refer to HPI    Medications: I have reviewed the patient's current medications.  Current Outpatient Medications  Medication Sig Dispense Refill  . ALPRAZolam (XANAX) 0.25 MG tablet Take one tablet by mouth three times daily. 90 tablet 2  . citalopram (CELEXA) 40 MG tablet Take 1 tablet (40 mg total) by  mouth daily. (prescribed by Psych) 90 tablet 1  . fenofibrate (TRICOR) 145 MG tablet Take 1 tablet (145 mg total) by mouth daily. 30 tablet 3  . fluticasone (CUTIVATE) 0.05 % cream Apply 1 application topically 2 (two) times daily.    . hydrOXYzine (ATARAX/VISTARIL) 10 MG tablet Take one tablet four times daily as needed for anxiety. 120 tablet 2  . lisdexamfetamine (VYVANSE) 30 MG capsule Take 1 capsule (30 mg total) by mouth daily. 30 capsule 0  . losartan (COZAAR) 50 MG tablet Take 1.5 tablets (75 mg total) by mouth daily. 45 tablet 1  . metoprolol succinate (TOPROL-XL) 25 MG 24 hr tablet TAKE 1 TABLET BY MOUTH EVERY DAY ALONG WITH THE 50MG  TO TOTAL 75MG  DAILY 90 tablet 1  . metoprolol succinate (TOPROL-XL) 50 MG 24 hr tablet 1 TABLET ONCE DAILY ALONG WITH 25MG  TO TOTAL 75MG  DAILY. TAKE WITH OR IMMEDIATELY FOLLOWING A MEAL 90 tablet 1  . rosuvastatin (CRESTOR) 5 MG tablet TAKE 1 TABLET BY MOUTH EVERY DAY (Patient not taking: Reported on 08/02/2020) 90 tablet 1  . testosterone cypionate (DEPOTESTOSTERONE CYPIONATE) 200 MG/ML injection Inject 0.15 mg into the muscle every 14 (fourteen) days. No bottle shown for this medication    . triamcinolone (KENALOG) 0.1 % Apply 1 application topically 2 (two) times daily. 30 g 0  . zolpidem (AMBIEN) 10 MG tablet Take one tablet by mouth once daily at bedtime 30 tablet 2   No current facility-administered medications for this visit.    Medication Side Effects: None  Allergies:  Allergies  Allergen Reactions  . Benzocaine-Menthol   . Cholecalciferol   . Skin Adhesives [Cyanoacrylate]   . Chlorhexidine Rash and Hives  . Codeine Other (See Comments)    Stomach problems patient reports  . Iodinated Diagnostic Agents Rash  . Penicillins Rash  . Sulfa Antibiotics Rash    Past Medical History:  Diagnosis Date  . Anxiety   . Arthritis   . Chronic kidney disease 2008   Nephrectomy   . Depression   . High blood pressure   . High cholesterol   .  Insomnia     Family History  Problem Relation Age of Onset  . Osteoporosis Mother   . Arthritis Mother     Social History   Socioeconomic History  . Marital status: Married    Spouse name: Not on file  . Number of children: Not on file  . Years of education: Not on file  . Highest education level: Not on file  Occupational History  . Not on file  Tobacco Use  . Smoking status: Never Smoker  . Smokeless tobacco: Never Used  Substance and Sexual Activity  . Alcohol use: Not Currently    Alcohol/week: 2.0 standard drinks    Types: 2 Glasses of wine per week    Comment: Daily  . Drug use: Never  . Sexual activity: Not on file  Other Topics Concern  . Not on file  Social History Narrative   Tobacco use:  None   Alcohol use: 7 per week   Diet:  Low carb.    Do you drink/eat things with caffeine?  A cup of coffee a day   Marital status:  Married   Do you live in a house, apartment, assisted living, condo, trailer, etc.)?  House   Is it one or more stories?  One story    How many persons live in your home?  Two (patient stated one, but is married)   Do you have any pets in your home? (please list) Dogs and birds   Current or past profession:  Retired   Highest level of education:  High school   Do you exercise:  Occasionally - twice a week.       Advanced Directive   No living will   No DNR   No POA/HPOA      Functional status:   No difficulty bathing or dressing   No difficulty preparing food or eating   No difficulty managing medications   No difficulty managing finances   No difficulty affording medications.      Social Determinants of Health   Financial Resource Strain:   . Difficulty of Paying Living Expenses: Not on file  Food Insecurity:   . Worried About Programme researcher, broadcasting/film/video in the Last Year: Not on file  . Ran Out of Food in the Last Year: Not on file  Transportation Needs:   . Lack of Transportation (Medical): Not on file  . Lack of Transportation  (Non-Medical): Not on file  Physical Activity:   . Days of Exercise per Week: Not on file  . Minutes of Exercise per Session: Not on file  Stress:   . Feeling of Stress : Not on file  Social Connections:   . Frequency of Communication with Friends and Family: Not on file  . Frequency of Social Gatherings with Friends and Family: Not on file  . Attends Religious Services: Not on file  . Active Member of Clubs or Organizations: Not on file  .  Attends Banker Meetings: Not on file  . Marital Status: Not on file  Intimate Partner Violence:   . Fear of Current or Ex-Partner: Not on file  . Emotionally Abused: Not on file  . Physically Abused: Not on file  . Sexually Abused: Not on file    Past Medical History, Surgical history, Social history, and Family history were reviewed and updated as appropriate.   Please see review of systems for further details on the patient's review from today.   Objective:   Physical Exam:  There were no vitals taken for this visit.  Physical Exam Constitutional:      General: She is not in acute distress. Musculoskeletal:        General: No deformity.  Neurological:     Mental Status: She is alert and oriented to person, place, and time.     Coordination: Coordination normal.  Psychiatric:        Attention and Perception: Attention and perception normal. She does not perceive auditory or visual hallucinations.        Mood and Affect: Mood normal. Mood is not anxious or depressed. Affect is not labile, blunt, angry or inappropriate.        Speech: Speech normal.        Behavior: Behavior normal.        Thought Content: Thought content normal. Thought content is not paranoid or delusional. Thought content does not include homicidal or suicidal ideation. Thought content does not include homicidal or suicidal plan.        Cognition and Memory: Cognition and memory normal.        Judgment: Judgment normal.     Comments: Insight intact      Lab Review:     Component Value Date/Time   NA 140 07/31/2020 0823   K 4.6 07/31/2020 0823   CL 106 07/31/2020 0823   CO2 24 07/31/2020 0823   GLUCOSE 85 07/31/2020 0823   BUN 13 07/31/2020 0823   CREATININE 0.95 07/31/2020 0823   CALCIUM 9.3 07/31/2020 0823   PROT 6.8 07/31/2020 0823   AST 15 07/31/2020 0823   ALT 14 07/31/2020 0823   BILITOT 0.7 07/31/2020 0823   GFRNONAA 70 07/31/2020 0823   GFRAA 82 07/31/2020 0823       Component Value Date/Time   WBC 9.2 08/02/2020 1344   RBC 5.27 (H) 08/02/2020 1344   HGB 15.9 (H) 08/02/2020 1344   HCT 47.3 (H) 08/02/2020 1344   PLT 388 08/02/2020 1344   MCV 89.8 08/02/2020 1344   MCH 30.2 08/02/2020 1344   MCHC 33.6 08/02/2020 1344   RDW 12.0 08/02/2020 1344   LYMPHSABS 3,110 08/02/2020 1344   EOSABS 570 (H) 08/02/2020 1344   BASOSABS 74 08/02/2020 1344    No results found for: POCLITH, LITHIUM   No results found for: PHENYTOIN, PHENOBARB, VALPROATE, CBMZ   .res Assessment: Plan:    Plan:  PDMP reviewed  1. Celexa 40mg  daily 2. Ambien 10mg  daily 3. Xanax 0.25mg  daily to three times daily 4. Add Hydroxyzine 10mg  - 4 x daily for anxiety.   [facility] ADHD test 37/58 ADHD possible  RTC 4 weeks  Patient advised to contact office with any questions, adverse effects, or acute worsening in signs and symptoms.  Discussed potential benefits, risks, and side effects of stimulants with patient to include increased heart rate, palpitations, insomnia, increased anxiety, increased irritability, or decreased appetite.  Instructed patient to contact office if experiencing any significant  tolerability issues.  Discussed potential benefits, risk, and side effects of benzodiazepines to include potential risk of tolerance and dependence, as well as possible drowsiness.  Advised patient not to drive if experiencing drowsiness and to take lowest possible effective dose to minimize risk of dependence and  tolerance.   Diagnoses and all orders for this visit:  Panic attacks  Generalized anxiety disorder -     hydrOXYzine (ATARAX/VISTARIL) 10 MG tablet; Take one tablet four times daily as needed for anxiety.  Major depressive disorder, recurrent episode, moderate (HCC)  Insomnia, unspecified type  Attention deficit hyperactivity disorder (ADHD), unspecified ADHD type -     lisdexamfetamine (VYVANSE) 30 MG capsule; Take 1 capsule (30 mg total) by mouth daily.     Please see After Visit Summary for patient specific instructions.  Future Appointments  Date Time Provider Department Center  01/25/2021  8:15 AM [facility] None  01/31/2021 10:00 AM Ngetich, Dinah C, NP [facility] None    No orders of the defined types were placed in this encounter.   -------------------------------

## 2020-08-07 NOTE — Telephone Encounter (Signed)
Already on strongest anti itchy medication.Has taken Benadryl and used steroid cream II recommend following up again with Dermatologist for further evaluation of rash/itching.Continue hydroxyzine until seen by dermatologist.

## 2020-08-07 NOTE — Telephone Encounter (Signed)
Patient states the rash is not improved and seems to be spreading. She stated that the Kenalog is not working. She is requesting something else.

## 2020-08-07 NOTE — Telephone Encounter (Signed)
Message routed to PCP Ngetich, Donalee Citrin, NP. Please Advise.

## 2020-08-07 NOTE — Telephone Encounter (Signed)
Called patient and discussed recommendation. Patient understood and is calling Dermatologist now to schedule appointment as soon as possible.

## 2020-08-08 DIAGNOSIS — L258 Unspecified contact dermatitis due to other agents: Secondary | ICD-10-CM | POA: Diagnosis not present

## 2020-08-08 DIAGNOSIS — L700 Acne vulgaris: Secondary | ICD-10-CM | POA: Diagnosis not present

## 2020-08-08 NOTE — Telephone Encounter (Signed)
Patient had called again and was told to contact dermatologist.

## 2020-08-21 ENCOUNTER — Telehealth: Payer: Self-pay | Admitting: Family

## 2020-08-22 ENCOUNTER — Other Ambulatory Visit: Payer: Self-pay

## 2020-08-22 ENCOUNTER — Encounter: Payer: Self-pay | Admitting: Family

## 2020-08-22 ENCOUNTER — Ambulatory Visit (INDEPENDENT_AMBULATORY_CARE_PROVIDER_SITE_OTHER): Payer: BC Managed Care – PPO | Admitting: Family

## 2020-08-22 VITALS — BP 134/90 | HR 80 | Temp 98.7°F | Resp 16 | Ht 63.0 in | Wt 154.4 lb

## 2020-08-22 DIAGNOSIS — R1010 Upper abdominal pain, unspecified: Secondary | ICD-10-CM

## 2020-08-22 NOTE — Progress Notes (Signed)
Provider: Richarda Blade FNP-C  Ngetich, Donalee Citrin, NP  Patient Care Team: Ngetich, Donalee Citrin, NP as PCP - General (Family Medicine)  Extended Emergency Contact Information Primary Emergency Contact: jossette, zirbel Mobile Phone: [phone] Relation: Spouse  Code Status: Full Code  Goals of care: Advanced Directive information Advanced Directives 08/22/2020  Does Patient Have a Medical Advance Directive? No  Would patient like information on creating a medical advance directive? No - Patient declined     Chief Complaint  Patient presents with  . Acute Visit    Complains of right rib pain.    HPI:  Pt is a 49 y.o. female seen today for an acute visit for evaluation of  Right upper abdomen pain which started after her liposuction.Pain is located on the right upper quadrant and sometimes across the upper quadrant to the left upper quadrant.she describes pain as dull and sometimes sharp.Pain worst when she sits and after meals.Pain does not radiate anywhere.No pain with deep breathing. Had a hernia repaired in the past 10 yrs ago. No cough,shortness of breath. She is off Lipitor due to itching. Though had been taking Lipitor before the liposuction.   Past Medical History:  Diagnosis Date  . Anxiety   . Arthritis   . Chronic kidney disease 2008   Nephrectomy   . Depression   . High blood pressure   . High cholesterol   . Insomnia    Past Surgical History:  Procedure Laterality Date  . NEPHRECTOMY  2008   Done in [place]  . VAGINAL HYSTERECTOMY  2010   Dr. Ernie Hew    Allergies  Allergen Reactions  . Benzocaine-Menthol   . Cholecalciferol   . Other     Other reaction(s): Unknown  . Skin Adhesives [Cyanoacrylate]   . Chlorhexidine Rash and Hives  . Codeine Other (See Comments)    Stomach problems patient reports  . Iodinated Diagnostic Agents Rash  . Penicillins Rash  . Sulfa Antibiotics Rash    Outpatient Encounter Medications as of 08/22/2020  Medication  Sig  . ALPRAZolam (XANAX) 0.25 MG tablet Take one tablet by mouth three times daily.  . citalopram (CELEXA) 40 MG tablet Take 1 tablet (40 mg total) by mouth daily. (prescribed by Psych)  . fenofibrate (TRICOR) 145 MG tablet Take 1 tablet (145 mg total) by mouth daily.  . fluticasone (CUTIVATE) 0.05 % cream Apply 1 application topically 2 (two) times daily.  . hydrOXYzine (ATARAX/VISTARIL) 10 MG tablet Take one tablet four times daily as needed for anxiety.  Marland Kitchen lisdexamfetamine (VYVANSE) 30 MG capsule Take 1 capsule (30 mg total) by mouth daily.  Marland Kitchen losartan (COZAAR) 50 MG tablet Take 1.5 tablets (75 mg total) by mouth daily.  . metoprolol succinate (TOPROL-XL) 25 MG 24 hr tablet TAKE 1 TABLET BY MOUTH EVERY DAY ALONG WITH THE 50MG  TO TOTAL 75MG  DAILY  . metoprolol succinate (TOPROL-XL) 50 MG 24 hr tablet 1 TABLET ONCE DAILY ALONG WITH 25MG  TO TOTAL 75MG  DAILY. TAKE WITH OR IMMEDIATELY FOLLOWING A MEAL  . testosterone cypionate (DEPOTESTOSTERONE CYPIONATE) 200 MG/ML injection Inject 0.15 mg into the muscle every 14 (fourteen) days. No bottle shown for this medication  . triamcinolone (KENALOG) 0.1 % Apply 1 application topically 2 (two) times daily.  zolpidem (AMBIEN) 10 MG tablet Take one tablet by mouth once daily at bedtime  . [DISCONTINUED] rosuvastatin (CRESTOR) 5 MG tablet TAKE 1 TABLET BY MOUTH EVERY DAY (Patient not taking: Reported on 08/02/2020)   No facility-administered encounter medications  on file as of 08/22/2020.    Review of Systems  Constitutional: Negative for appetite change, chills, fatigue and fever.  Respiratory: Negative for cough, chest tightness, shortness of breath and wheezing.   Cardiovascular: Negative for chest pain, palpitations and leg swelling.  Gastrointestinal: Positive for abdominal pain. Negative for abdominal distention, constipation, diarrhea, nausea and vomiting.  Musculoskeletal: Negative for back pain and gait problem.  Skin: Negative for color  change, pallor and rash.  Neurological: Negative for dizziness, light-headedness and headaches.    Immunization History  Administered Date(s) Administered  . PFIZER SARS-COV-2 Vaccination 05/26/2020, 06/16/2020   Pertinent  Health Maintenance Due  Topic Date Due  . PAP SMEAR-Modifier  Never done  . INFLUENZA VACCINE  Never done   Fall Risk  08/22/2020 07/31/2020 04/14/2020 01/19/2020  Falls in the past year? 0 0 0 0  Number falls in past yr: 0 0 0 0  Injury with Fall? 0 0 0 0   Functional Status Survey:    Vitals:   08/22/20 1024  BP: 134/90  Pulse: 80  Resp: 16  Temp: 98.7 F (37.1 C)  SpO2: 98%  Weight: 154 lb 6.4 oz (70 kg)  Height: 5\' 3"  (1.6 m)   Body mass index is 27.35 kg/m. Physical Exam Vitals reviewed.  Constitutional:      General: She is not in acute distress.    Appearance: She is not ill-appearing.  Eyes:     General: No scleral icterus.       Right eye: No discharge.        Left eye: No discharge.     Conjunctiva/sclera: Conjunctivae normal.     Pupils: Pupils are equal, round, and reactive to light.  Cardiovascular:     Rate and Rhythm: Normal rate and regular rhythm.     Pulses: Normal pulses.     Heart sounds: Normal heart sounds. No murmur heard. No friction rub. No gallop.   Pulmonary:     Effort: Pulmonary effort is normal. No respiratory distress.     Breath sounds: Normal breath sounds. No wheezing, rhonchi or rales.  Chest:     Chest wall: No tenderness.  Abdominal:     General: Bowel sounds are normal. There is no distension.     Palpations: Abdomen is soft. There is no mass.     Tenderness: There is abdominal tenderness in the right upper quadrant and left upper quadrant. There is no right CVA tenderness, left CVA tenderness, guarding or rebound.  Musculoskeletal:        General: No swelling or tenderness. Normal range of motion.     Right lower leg: No edema.     Left lower leg: No edema.  Skin:    General: Skin is warm and dry.      Coloration: Skin is not pale.     Findings: No bruising, erythema or rash.  Neurological:     Mental Status: She is alert and oriented to person, place, and time.     Cranial Nerves: No cranial nerve deficit.     Motor: No weakness.     Gait: Gait normal.  Psychiatric:        Mood and Affect: Mood normal.        Speech: Speech normal.        Behavior: Behavior normal.        Thought Content: Thought content normal.        Judgment: Judgment normal.     Labs reviewed: Recent  Labs    01/21/20 0816 07/31/20 0823  NA 137 140  K 4.3 4.6  CL 105 106  CO2 27 24  GLUCOSE 90 85  BUN 9 13  CREATININE 0.76 0.95  CALCIUM 9.2 9.3  MG 1.7  --    Recent Labs    01/21/20 0816 07/31/20 0823  AST 12 15  ALT 10 14  BILITOT 1.3* 0.7  PROT 6.6 6.8   Recent Labs    07/31/20 0823 08/02/20 1344  WBC 12.5* 9.2  NEUTROABS 7,125 4,692  HGB 15.8* 15.9*  HCT 47.5* 47.3*  MCV 91.7 89.8  PLT 393 388   Lab Results  Component Value Date   TSH 1.66 07/31/2020   No results found for: HGBA1C Lab Results  Component Value Date   CHOL 157 07/31/2020   HDL 38 (L) 07/31/2020   LDLCALC 88 07/31/2020   TRIG 214 (H) 07/31/2020   CHOLHDL 4.1 07/31/2020    Significant Diagnostic Results in last 30 days:  No results found.  Assessment/Plan Pain of upper abdomen Afebrile.unclear etiology but possible still recovering from liposuction.  Right upper Quadrant tender to palpation.Abdomen non distended,Bowel sounds present x 4 quadrant. No mass noted.will rule out other acute abnormalities.  - US Abdomen Complete; Future - Advised to notify provider or go to ED for any worsening Abdominal  Pain,Nausea or vomiting.  Family/ staff Communication: Reviewed plan of care with patient  Labs/tests ordered: - US Abdomen Complete; Future  Next Appointment: As needed if symptoms worsen of fail to improve.  Caesar Bookman, NP

## 2020-08-22 NOTE — Patient Instructions (Signed)
-   Please get  Abdomen Ultrasound at [facility] imaging at [address]

## 2020-08-26 ENCOUNTER — Other Ambulatory Visit: Payer: Self-pay | Admitting: Adult Health

## 2020-08-26 DIAGNOSIS — G4700 Insomnia, unspecified: Secondary | ICD-10-CM

## 2020-09-05 ENCOUNTER — Ambulatory Visit: Payer: PRIVATE HEALTH INSURANCE | Admitting: Adult Health

## 2020-09-06 ENCOUNTER — Ambulatory Visit
Admission: RE | Admit: 2020-09-06 | Discharge: 2020-09-06 | Disposition: A | Discharge status: Home / Self Care | Payer: BC Managed Care – PPO | Source: Ambulatory Visit | Attending: Family | Admitting: Family

## 2020-09-06 DIAGNOSIS — Z905 Acquired absence of kidney: Secondary | ICD-10-CM | POA: Diagnosis not present

## 2020-09-06 DIAGNOSIS — R109 Unspecified abdominal pain: Secondary | ICD-10-CM | POA: Diagnosis not present

## 2020-09-06 DIAGNOSIS — R1010 Upper abdominal pain, unspecified: Secondary | ICD-10-CM

## 2020-09-11 ENCOUNTER — Telehealth: Payer: Self-pay | Admitting: *Deleted

## 2020-09-11 DIAGNOSIS — R1010 Upper abdominal pain, unspecified: Secondary | ICD-10-CM

## 2020-09-11 NOTE — Telephone Encounter (Signed)
Referral order placed for General Surgery for evaluation right upper Abdomen pain.

## 2020-09-11 NOTE — Telephone Encounter (Signed)
Discussed with the patient. She will wait to hear from general surgeon's office.

## 2020-09-11 NOTE — Telephone Encounter (Signed)
Patient called and stated that she called her Surgeon regarding the Ultrasound and tried to get an appointment. They would not give her one. Stated she needs a referral.   Please Place referral for patient to be seen by Surgeon.        Donalee Citrin Ngetich, NP  09/07/2020 3:38 PM EST      A ring down artifact noted along the wall of the gall bladder suggesting possible inflammation suspect due to recent liposuction.if still having any symptoms I recommend following up with Generally surgery.

## 2020-09-13 ENCOUNTER — Telehealth: Payer: Self-pay | Admitting: Physician Assistant

## 2020-09-13 ENCOUNTER — Other Ambulatory Visit: Payer: Self-pay | Admitting: Adult Health

## 2020-09-13 DIAGNOSIS — F909 Attention-deficit hyperactivity disorder, unspecified type: Secondary | ICD-10-CM

## 2020-09-13 MED ORDER — LISDEXAMFETAMINE DIMESYLATE 30 MG PO CAPS: 30.0000 mg | ORAL_CAPSULE | Freq: Every day | ORAL | 0 refills | Status: DC

## 2020-09-13 NOTE — Telephone Encounter (Signed)
Pt lm requesting a refill on the Vyvanse. [facility] [address]. Last seen 11/29 no follow up scheduled. Seen by R Mozingo ( msg was left for [facility]) Is this your pt now?

## 2020-09-13 NOTE — Telephone Encounter (Signed)
Not my pt now, at least to my knowledge. Almira Coaster, I'm forwarding to you.

## 2020-09-13 NOTE — Telephone Encounter (Signed)
Noted  

## 2020-09-14 ENCOUNTER — Other Ambulatory Visit: Payer: BC Managed Care – PPO

## 2020-09-26 ENCOUNTER — Other Ambulatory Visit: Payer: Self-pay | Admitting: Adult Health

## 2020-09-26 DIAGNOSIS — G4700 Insomnia, unspecified: Secondary | ICD-10-CM

## 2020-09-27 ENCOUNTER — Other Ambulatory Visit: Payer: Self-pay | Admitting: Family

## 2020-09-27 DIAGNOSIS — I10 Essential (primary) hypertension: Secondary | ICD-10-CM

## 2020-09-27 NOTE — Telephone Encounter (Signed)
High risk or very high risk warning populated when attempting to refill medication (PREGNANCY WARNING).  RX request sent to PCP for review and approval if warranted.

## 2020-10-05 DIAGNOSIS — R109 Unspecified abdominal pain: Secondary | ICD-10-CM | POA: Diagnosis not present

## 2020-10-07 ENCOUNTER — Other Ambulatory Visit: Payer: Self-pay | Admitting: Family

## 2020-10-10 ENCOUNTER — Other Ambulatory Visit (HOSPITAL_COMMUNITY): Payer: Self-pay | Admitting: Surgery

## 2020-10-10 ENCOUNTER — Other Ambulatory Visit: Payer: Self-pay | Admitting: Surgery

## 2020-10-10 DIAGNOSIS — R109 Unspecified abdominal pain: Secondary | ICD-10-CM

## 2020-10-13 ENCOUNTER — Telehealth: Payer: Self-pay | Admitting: *Deleted

## 2020-10-13 NOTE — Telephone Encounter (Signed)
Patient called and stated that the rash has came back like before under her breat. Stated that you had seen her 07/31/2020 for this rash and she has all the same signs and symptoms as before. Hasn't used anything new. Patient stated that she did buy a new bra and wonders if it is from that.  Offered an appointment but patient could not come in and doesn't want to go the whole weekend without relief.   Stated that she still has all the medication to use for it, from before,  except for the Steroid. Patient is wanting to know if you would prescribe the Steroid to help clear it up faster.   Please Advise.    OV NOTE Dated 07/31/2020: Assessment/Plan   Rash and nonspecific skin eruption Afebrile. Bilateral upper abdomen erythema itch non raised rash.suspected for skin cleanser used prior to procedure. - Advised to take Hydroxyzine three times as needed for itching. - apply Triamcinolone cream to affected areas twice daily.  - advised to apply cool compressor at least 10-15 minutes daily and PRN to help with the itching.  - hydrOXYzine (ATARAX/VISTARIL) 10 MG tablet; Take 1 tablet (10 mg total) by mouth 3 (three) times daily as needed for itching.  Dispense: 30 tablet; Refill: 0 - triamcinolone (KENALOG) 0.1 %; Apply 1 application topically 2 (two) times daily.  Dispense: 30 g; Refill: 0 - Has follow up appointment for routine visit 08/02/2020  - follow up at the urgent care or ED if symptoms worsen.

## 2020-10-13 NOTE — Telephone Encounter (Signed)
Patient Notified and agreed. 

## 2020-10-13 NOTE — Telephone Encounter (Signed)
Continue on  hydroxyzine for itching.Recommend evaluation by dermatologist as soon as possible due to recurrence of rash.

## 2020-10-23 ENCOUNTER — Other Ambulatory Visit: Payer: Self-pay | Admitting: Adult Health

## 2020-10-23 DIAGNOSIS — G4700 Insomnia, unspecified: Secondary | ICD-10-CM

## 2020-10-24 ENCOUNTER — Telehealth: Payer: Self-pay | Admitting: Adult Health

## 2020-10-24 ENCOUNTER — Ambulatory Visit (HOSPITAL_COMMUNITY): Payer: BC Managed Care – PPO

## 2020-10-24 ENCOUNTER — Other Ambulatory Visit: Payer: Self-pay | Admitting: Adult Health

## 2020-10-24 DIAGNOSIS — F909 Attention-deficit hyperactivity disorder, unspecified type: Secondary | ICD-10-CM

## 2020-10-24 MED ORDER — LISDEXAMFETAMINE DIMESYLATE 30 MG PO CAPS: 30.0000 mg | ORAL_CAPSULE | Freq: Every day | ORAL | 0 refills | Status: DC

## 2020-10-24 NOTE — Telephone Encounter (Signed)
Pt called and left a message stating that she needs a refill on her vyvanse 30 mg [facility] [address]

## 2020-10-24 NOTE — Telephone Encounter (Signed)
Script sent  

## 2020-10-26 ENCOUNTER — Encounter (HOSPITAL_COMMUNITY): Payer: Self-pay

## 2020-10-26 ENCOUNTER — Ambulatory Visit (HOSPITAL_COMMUNITY): Payer: BC Managed Care – PPO

## 2020-10-31 DIAGNOSIS — M7542 Impingement syndrome of left shoulder: Secondary | ICD-10-CM | POA: Diagnosis not present

## 2020-10-31 DIAGNOSIS — M519 Unspecified thoracic, thoracolumbar and lumbosacral intervertebral disc disorder: Secondary | ICD-10-CM | POA: Diagnosis not present

## 2020-11-17 ENCOUNTER — Other Ambulatory Visit: Payer: Self-pay

## 2020-11-17 ENCOUNTER — Ambulatory Visit (HOSPITAL_COMMUNITY)
Admission: RE | Admit: 2020-11-17 | Discharge: 2020-11-17 | Disposition: A | Discharge status: Home / Self Care | Payer: BC Managed Care – PPO | Source: Ambulatory Visit | Attending: Surgery | Admitting: Surgery

## 2020-11-17 DIAGNOSIS — R1011 Right upper quadrant pain: Secondary | ICD-10-CM | POA: Diagnosis not present

## 2020-11-17 DIAGNOSIS — R109 Unspecified abdominal pain: Secondary | ICD-10-CM

## 2020-11-17 MED ORDER — TECHNETIUM TC 99M MEBROFENIN IV KIT
5.5000 | PACK | Freq: Once | INTRAVENOUS | Status: AC | PRN
  Administered 2020-11-17: 5.5 via INTRAVENOUS

## 2020-11-21 ENCOUNTER — Telehealth (HOSPITAL_COMMUNITY): Payer: Self-pay

## 2020-11-21 ENCOUNTER — Other Ambulatory Visit (HOSPITAL_COMMUNITY): Payer: Self-pay | Admitting: Surgery

## 2020-11-21 DIAGNOSIS — R109 Unspecified abdominal pain: Secondary | ICD-10-CM

## 2020-11-22 ENCOUNTER — Telehealth: Payer: Self-pay | Admitting: Adult Health

## 2020-11-22 NOTE — Telephone Encounter (Signed)
Patient called in today regarding concerns using Vyvanse 30mg . She states that it is affecting her personal life in some ways negatively and positively. She did not wish to go into further detail. She would like a CB ASAP. Ph: [phone]

## 2020-11-22 NOTE — Telephone Encounter (Signed)
I spoke to the pt and she wanted me to let you know since taking vyvanse she is doing tremendously well and almost feels like she got her regular self back.The problem she is having is that during intercourse she is not able to have a orgasm and is left unsatisfied.This is the only side affect she is having.She understands if you are not able to call her directly but this is why she was reluctant to give more details to [place]e because it was personal.

## 2020-11-22 NOTE — Telephone Encounter (Signed)
Noted. That may or may not improve. We could offer a lower dose of the Vyvanse if interested.

## 2020-11-22 NOTE — Telephone Encounter (Signed)
Can we get more information? Last visit in November.

## 2020-11-23 ENCOUNTER — Other Ambulatory Visit: Payer: Self-pay | Admitting: Adult Health

## 2020-11-23 DIAGNOSIS — F909 Attention-deficit hyperactivity disorder, unspecified type: Secondary | ICD-10-CM

## 2020-11-23 DIAGNOSIS — G4700 Insomnia, unspecified: Secondary | ICD-10-CM

## 2020-11-23 MED ORDER — LISDEXAMFETAMINE DIMESYLATE 20 MG PO CAPS: 20.0000 mg | ORAL_CAPSULE | Freq: Every day | ORAL | 0 refills | Status: DC

## 2020-11-23 NOTE — Telephone Encounter (Signed)
Pt said she is willing to try the low dose.

## 2020-11-23 NOTE — Telephone Encounter (Signed)
Script sent for Vyvanse 20mg  daily. If symptoms unresolved with lower dose, D/C medication.

## 2020-11-27 NOTE — Telephone Encounter (Signed)
Patient lm( second request ) stating her Zolpidem has not been filled. Patient has not been seen since 11/29 with multiply can/ns, an no follow up scheduled. [facility] fill at : [id number] [place], [place] - [address] .

## 2020-11-27 NOTE — Telephone Encounter (Signed)
Please review

## 2020-11-27 NOTE — Telephone Encounter (Signed)
Please schedule appointment.

## 2020-11-29 DIAGNOSIS — M25562 Pain in left knee: Secondary | ICD-10-CM | POA: Diagnosis not present

## 2020-11-29 DIAGNOSIS — M5412 Radiculopathy, cervical region: Secondary | ICD-10-CM | POA: Diagnosis not present

## 2020-11-29 DIAGNOSIS — M25512 Pain in left shoulder: Secondary | ICD-10-CM | POA: Diagnosis not present

## 2020-11-30 ENCOUNTER — Ambulatory Visit (HOSPITAL_BASED_OUTPATIENT_CLINIC_OR_DEPARTMENT_OTHER): Payer: BC Managed Care – PPO

## 2020-11-30 ENCOUNTER — Encounter (HOSPITAL_BASED_OUTPATIENT_CLINIC_OR_DEPARTMENT_OTHER): Payer: Self-pay

## 2020-12-01 ENCOUNTER — Other Ambulatory Visit: Payer: Self-pay | Admitting: Family

## 2020-12-01 DIAGNOSIS — E781 Pure hyperglyceridemia: Secondary | ICD-10-CM

## 2020-12-01 NOTE — Telephone Encounter (Signed)
High risk or very high risk warning populated when attempting to refill medication. RX request sent to PCP for review and approval if warranted.   Pregnancy Warning

## 2020-12-11 NOTE — Telephone Encounter (Signed)
Error

## 2020-12-13 DIAGNOSIS — L700 Acne vulgaris: Secondary | ICD-10-CM | POA: Diagnosis not present

## 2020-12-19 DIAGNOSIS — M5412 Radiculopathy, cervical region: Secondary | ICD-10-CM | POA: Diagnosis not present

## 2020-12-25 ENCOUNTER — Other Ambulatory Visit: Payer: Self-pay | Admitting: Adult Health

## 2020-12-25 DIAGNOSIS — G4700 Insomnia, unspecified: Secondary | ICD-10-CM

## 2020-12-26 ENCOUNTER — Ambulatory Visit: Payer: PRIVATE HEALTH INSURANCE | Admitting: Adult Health

## 2020-12-27 ENCOUNTER — Other Ambulatory Visit: Payer: Self-pay

## 2020-12-27 ENCOUNTER — Encounter: Payer: Self-pay | Admitting: Adult Health

## 2020-12-27 ENCOUNTER — Ambulatory Visit (INDEPENDENT_AMBULATORY_CARE_PROVIDER_SITE_OTHER): Payer: PRIVATE HEALTH INSURANCE | Admitting: Adult Health

## 2020-12-27 DIAGNOSIS — F410 Panic disorder [episodic paroxysmal anxiety] without agoraphobia: Secondary | ICD-10-CM

## 2020-12-27 DIAGNOSIS — F411 Generalized anxiety disorder: Secondary | ICD-10-CM

## 2020-12-27 DIAGNOSIS — F331 Major depressive disorder, recurrent, moderate: Secondary | ICD-10-CM

## 2020-12-27 DIAGNOSIS — G4700 Insomnia, unspecified: Secondary | ICD-10-CM | POA: Diagnosis not present

## 2020-12-27 MED ORDER — CITALOPRAM HYDROBROMIDE 40 MG PO TABS: 40.0000 mg | ORAL_TABLET | Freq: Every day | ORAL | 1 refills | Status: DC

## 2020-12-27 MED ORDER — ALPRAZOLAM 0.25 MG PO TABS: ORAL_TABLET | ORAL | 2 refills | Status: DC

## 2020-12-27 MED ORDER — AMPHETAMINE-DEXTROAMPHET ER 30 MG PO CP24: 30.0000 mg | ORAL_CAPSULE | Freq: Every day | ORAL | 0 refills | Status: DC

## 2020-12-27 MED ORDER — ZOLPIDEM TARTRATE 10 MG PO TABS: ORAL_TABLET | ORAL | 2 refills | Status: DC

## 2020-12-27 NOTE — Progress Notes (Signed)
Christina Leonard [id number] [birth year] 50 y.o.  Subjective:   Patient ID:  Christina Leonard is a 50 y.o. (DOB 1971-03-07) female.  Chief Complaint: No chief complaint on file.   HPI Christina Leonard presents to the office today for follow-up of MDD, GAD, insomnia, and panic attacks.  Describes mood today as "ok". Pleasant. Mood symptoms - reports depression. Decreased anxiety. Irritable at times - "things are getting to me". Stating "I feel broken". She and husband are having issues - 'things are not normal". Stating "he is absent in the marriage". Does not feel like he is with her "emotionally". Has not tolerated reduction in Vyvanse 40mg  to 20mg  daily. Trying to find "positivity" - reading and listening to pod casts. Varying interest and motivation. Taking medications as prescribed.  Energy levels lower. Active, does not have a regular exercise routine. Enjoys some usual interests and activities. Married. Lives with husband x 25 years - 3 dog and 2 cats. Two daughters 65 and 92. Daughters are in 30. Moved to [place] from 25 for husband's job. Spending time with family. Appetite adequate. Weight loss - 142 from 155 pounds. Sleeps well most nights. Averages 7 to 8 hours with Ambien. Focus and concentration difficulties at times. Completing tasks. Managing aspects of household. Unemployed. Denies SI or HI.  Denies AH or VH.  One kidney x 12 years ago - working with PCP to manage BP.  Review of Systems:  Review of Systems  Musculoskeletal: Negative for gait problem.  Neurological: Negative for tremors.  Psychiatric/Behavioral:       Please refer to HPI    Medications: I have reviewed the patient's current medications.  Current Outpatient Medications  Medication Sig Dispense Refill  . amphetamine-dextroamphetamine (ADDERALL XR) 30 MG 24 hr capsule Take 1 capsule (30 mg total) by mouth daily. 30 capsule 0  . fenofibrate (TRICOR) 145 MG tablet TAKE 1 TABLET BY MOUTH EVERY DAY 90 tablet 1  .  losartan (COZAAR) 50 MG tablet Take 1.5 tablets (75 mg total) by mouth daily. DX I10 45 tablet 5  . metoprolol succinate (TOPROL-XL) 25 MG 24 hr tablet TAKE 1 TABLET BY MOUTH EVERY DAY ALONG WITH THE 50MG  TO TOTAL 75MG  DAILY 90 tablet 1  . metoprolol succinate (TOPROL-XL) 50 MG 24 hr tablet 1 TABLET ONCE DAILY ALONG WITH 25MG  TO TOTAL 75MG  DAILY. TAKE WITH OR IMMEDIATELY FOLLOWING A MEAL 90 tablet 1  . ALPRAZolam (XANAX) 0.25 MG tablet Take one tablet by mouth three times daily. 90 tablet 2  . citalopram (CELEXA) 40 MG tablet Take 1 tablet (40 mg total) by mouth daily. (prescribed by Psych) 90 tablet 1  . fluticasone (CUTIVATE) 0.05 % cream Apply 1 application topically 2 (two) times daily.    [place] testosterone cypionate (DEPOTESTOSTERONE CYPIONATE) 200 MG/ML injection Inject 0.15 mg into the muscle every 14 (fourteen) days. No bottle shown for this medication    . triamcinolone (KENALOG) 0.1 % Apply 1 application topically 2 (two) times daily. 30 g 0  . zolpidem (AMBIEN) 10 MG tablet Take one tablet at bedtime. 30 tablet 2   No current facility-administered medications for this visit.    Medication Side Effects: None  Allergies:  Allergies  Allergen Reactions  . Benzocaine-Menthol   . Cholecalciferol   . Other     Other reaction(s): Unknown  . Skin Adhesives [Cyanoacrylate]   . Chlorhexidine Rash and Hives  . Codeine Other (See Comments)    Stomach problems patient reports  . Iodinated Diagnostic Agents Rash  .  Penicillins Rash  . Sulfa Antibiotics Rash    Past Medical History:  Diagnosis Date  . Anxiety   . Arthritis   . Chronic kidney disease 2008   Nephrectomy   . Depression   . High blood pressure   . High cholesterol   . Insomnia     Past Medical History, Surgical history, Social history, and Family history were reviewed and updated as appropriate.   Please see review of systems for further details on the patient's review from today.   Objective:   Physical Exam:   There were no vitals taken for this visit.  Physical Exam Constitutional:      General: She is not in acute distress. Musculoskeletal:        General: No deformity.  Neurological:     Mental Status: She is alert and oriented to person, place, and time.     Coordination: Coordination normal.  Psychiatric:        Attention and Perception: Attention and perception normal. She does not perceive auditory or visual hallucinations.        Mood and Affect: Mood normal. Mood is not anxious or depressed. Affect is not labile, blunt, angry or inappropriate.        Speech: Speech normal.        Behavior: Behavior normal.        Thought Content: Thought content normal. Thought content is not paranoid or delusional. Thought content does not include homicidal or suicidal ideation. Thought content does not include homicidal or suicidal plan.        Cognition and Memory: Cognition and memory normal.        Judgment: Judgment normal.     Comments: Insight intact     Lab Review:     Component Value Date/Time   NA 140 07/31/2020 0823   K 4.6 07/31/2020 0823   CL 106 07/31/2020 0823   CO2 24 07/31/2020 0823   GLUCOSE 85 07/31/2020 0823   BUN 13 07/31/2020 0823   CREATININE 0.95 07/31/2020 0823   CALCIUM 9.3 07/31/2020 0823   PROT 6.8 07/31/2020 0823   AST 15 07/31/2020 0823   ALT 14 07/31/2020 0823   BILITOT 0.7 07/31/2020 0823   GFRNONAA 70 07/31/2020 0823   GFRAA 82 07/31/2020 0823       Component Value Date/Time   WBC 9.2 08/02/2020 1344   RBC 5.27 (H) 08/02/2020 1344   HGB 15.9 (H) 08/02/2020 1344   HCT 47.3 (H) 08/02/2020 1344   PLT 388 08/02/2020 1344   MCV 89.8 08/02/2020 1344   MCH 30.2 08/02/2020 1344   MCHC 33.6 08/02/2020 1344   RDW 12.0 08/02/2020 1344   LYMPHSABS 3,110 08/02/2020 1344   EOSABS 570 (H) 08/02/2020 1344   BASOSABS 74 08/02/2020 1344    No results found for: POCLITH, LITHIUM   No results found for: PHENYTOIN, PHENOBARB, VALPROATE, CBMZ    .res Assessment: Plan:    Plan:  PDMP reviewed  1. Celexa 40mg  daily 2. Ambien 10mg  daily 3. Xanax 0.25mg  daily to three times daily 4. D/C Vyvanse 20mg  daily 6. Add Adderall XR 30mg  every morning.  137/81/74  [facility] ADHD test 37/58 ADHD possible  RTC 4 weeks  Patient advised to contact office with any questions, adverse effects, or acute worsening in signs and symptoms.  Discussed potential benefits, risks, and side effects of stimulants with patient to include increased heart rate, palpitations, insomnia, increased anxiety, increased irritability, or decreased appetite.  Instructed patient to  contact office if experiencing any significant tolerability issues.  Discussed potential benefits, risk, and side effects of benzodiazepines to include potential risk of tolerance and dependence, as well as possible drowsiness.  Advised patient not to drive if experiencing drowsiness and to take lowest possible effective dose to minimize risk of dependence and tolerance.    Diagnoses and all orders for this visit:  Insomnia, unspecified type -     zolpidem (AMBIEN) 10 MG tablet; Take one tablet at bedtime.  Generalized anxiety disorder -     citalopram (CELEXA) 40 MG tablet; Take 1 tablet (40 mg total) by mouth daily. (prescribed by Psych)  Major depressive disorder, recurrent episode, moderate (HCC) -     citalopram (CELEXA) 40 MG tablet; Take 1 tablet (40 mg total) by mouth daily. (prescribed by Psych)  Panic attacks -     ALPRAZolam (XANAX) 0.25 MG tablet; Take one tablet by mouth three times daily.  Other orders -     amphetamine-dextroamphetamine (ADDERALL XR) 30 MG 24 hr capsule; Take 1 capsule (30 mg total) by mouth daily.     Please see After Visit Summary for patient specific instructions.  Future Appointments  Date Time Provider Department Center  01/25/2021  8:15 AM [facility] None  01/31/2021 10:00 AM Ngetich, Dinah C, NP [facility] None    No  orders of the defined types were placed in this encounter.   -------------------------------

## 2020-12-27 NOTE — Telephone Encounter (Signed)
Last filled on 11/27/20.No appt scheduled.

## 2021-01-02 ENCOUNTER — Telehealth: Payer: Self-pay | Admitting: Adult Health

## 2021-01-02 NOTE — Telephone Encounter (Signed)
Pt left v-mail requesting a mood stabilizer. Asking nurse to return call to give more details.  Call [phone]. Apt 5/18

## 2021-01-02 NOTE — Telephone Encounter (Signed)
Please add to cancellation list

## 2021-01-02 NOTE — Telephone Encounter (Signed)
Pt is asking if you can review her meds and possibly add a mood stabilizer .She says the adderall makes her feel down during the day and she is not able to focus also.

## 2021-01-03 ENCOUNTER — Ambulatory Visit (INDEPENDENT_AMBULATORY_CARE_PROVIDER_SITE_OTHER): Payer: PRIVATE HEALTH INSURANCE | Admitting: Adult Health

## 2021-01-03 ENCOUNTER — Other Ambulatory Visit: Payer: Self-pay

## 2021-01-03 ENCOUNTER — Encounter: Payer: Self-pay | Admitting: Adult Health

## 2021-01-03 DIAGNOSIS — F331 Major depressive disorder, recurrent, moderate: Secondary | ICD-10-CM | POA: Diagnosis not present

## 2021-01-03 DIAGNOSIS — F410 Panic disorder [episodic paroxysmal anxiety] without agoraphobia: Secondary | ICD-10-CM

## 2021-01-03 DIAGNOSIS — F411 Generalized anxiety disorder: Secondary | ICD-10-CM | POA: Diagnosis not present

## 2021-01-03 DIAGNOSIS — G4700 Insomnia, unspecified: Secondary | ICD-10-CM

## 2021-01-03 MED ORDER — ARIPIPRAZOLE 2 MG PO TABS
2.0000 mg | ORAL_TABLET | Freq: Every day | ORAL | 2 refills | Status: DC
Start: 2021-01-03 — End: 2021-01-30

## 2021-01-03 NOTE — Progress Notes (Signed)
Christina Leonard [id number] [birth year] 50 y.o.  Subjective:   Patient ID:  Christina Leonard is a 50 y.o. (DOB 1971/01/27) female.  Chief Complaint: No chief complaint on file.   HPI Christina Leonard presents to the office today for follow-up of MDD, GAD, insomnia, and panic attacks.  Describes mood today as "ok". Pleasant. Wanting to cry, but can't. Mood symptoms - reports depression , anxiety, and irritability. Has not tolerated Adderall XR. Feels "scattered". Not able to get things done. Feels like she is all "over the place". Mood is up and down - "normal and then wanting to cry". She and husband getting along better - "he has mood issues also". Is willing to try Abilify for mood stabilization. Varying interest and motivation. Taking medications as prescribed.  Energy levels lower. Active, does not have a regular exercise routine. Enjoys some usual interests and activities. Married. Lives with husband x 25 years - 3 dog and 2 cats. Two daughters 1 and 75. Daughters are in [place]. Moved to [place] from [place] for husband's job. Spending time with family. Appetite adequate. Weight stable 142 pounds. Sleeps well most nights. Averages 7 to 8 hours with Ambien. Focus and concentration difficulties at times. Completing tasks. Managing aspects of household. Unemployed. Denies SI or HI.  Denies AH or VH.  One kidney x 12 years ago - working with PCP to manage BP    Review of Systems:  Review of Systems  Musculoskeletal: Negative for gait problem.  Neurological: Negative for tremors.  Psychiatric/Behavioral:       Please refer to HPI    Medications: I have reviewed the patient's current medications.  Current Outpatient Medications  Medication Sig Dispense Refill  . fenofibrate (TRICOR) 145 MG tablet TAKE 1 TABLET BY MOUTH EVERY DAY 90 tablet 1  . losartan (COZAAR) 50 MG tablet Take 1.5 tablets (75 mg total) by mouth daily. DX I10 45 tablet 5  . metoprolol succinate (TOPROL-XL) 25 MG 24 hr tablet  TAKE 1 TABLET BY MOUTH EVERY DAY ALONG WITH THE 50MG  TO TOTAL 75MG  DAILY 90 tablet 1  . metoprolol succinate (TOPROL-XL) 50 MG 24 hr tablet 1 TABLET ONCE DAILY ALONG WITH 25MG  TO TOTAL 75MG  DAILY. TAKE WITH OR IMMEDIATELY FOLLOWING A MEAL 90 tablet 1  . ALPRAZolam (XANAX) 0.25 MG tablet Take one tablet by mouth three times daily. 90 tablet 2  . amphetamine-dextroamphetamine (ADDERALL XR) 30 MG 24 hr capsule Take 1 capsule (30 mg total) by mouth daily. 30 capsule 0  . citalopram (CELEXA) 40 MG tablet Take 1 tablet (40 mg total) by mouth daily. (prescribed by Psych) 90 tablet 1  . fluticasone (CUTIVATE) 0.05 % cream Apply 1 application topically 2 (two) times daily.    testosterone cypionate (DEPOTESTOSTERONE CYPIONATE) 200 MG/ML injection Inject 0.15 mg into the muscle every 14 (fourteen) days. No bottle shown for this medication    . triamcinolone (KENALOG) 0.1 % Apply 1 application topically 2 (two) times daily. 30 g 0  . zolpidem (AMBIEN) 10 MG tablet Take one tablet at bedtime. 30 tablet 2   No current facility-administered medications for this visit.    Medication Side Effects: None  Allergies:  Allergies  Allergen Reactions  . Benzocaine-Menthol   . Cholecalciferol   . Other     Other reaction(s): Unknown  . Skin Adhesives [Cyanoacrylate]   . Chlorhexidine Rash and Hives  . Codeine Other (See Comments)    Stomach problems patient reports  . Iodinated Diagnostic Agents Rash  . Penicillins  Rash  . Sulfa Antibiotics Rash    Past Medical History:  Diagnosis Date  . Anxiety   . Arthritis   . Chronic kidney disease 2008   Nephrectomy   . Depression   . High blood pressure   . High cholesterol   . Insomnia     Past Medical History, Surgical history, Social history, and Family history were reviewed and updated as appropriate.   Please see review of systems for further details on the patient's review from today.   Objective:   Physical Exam:  There were no vitals taken  for this visit.  Physical Exam Constitutional:      General: She is not in acute distress. Musculoskeletal:        General: No deformity.  Neurological:     Mental Status: She is alert and oriented to person, place, and time.     Coordination: Coordination normal.  Psychiatric:        Attention and Perception: Attention and perception normal. She does not perceive auditory or visual hallucinations.        Mood and Affect: Mood normal. Mood is not anxious or depressed. Affect is not labile, blunt, angry or inappropriate.        Speech: Speech normal.        Behavior: Behavior normal.        Thought Content: Thought content normal. Thought content is not paranoid or delusional. Thought content does not include homicidal or suicidal ideation. Thought content does not include homicidal or suicidal plan.        Cognition and Memory: Cognition and memory normal.        Judgment: Judgment normal.     Comments: Insight intact     Lab Review:     Component Value Date/Time   NA 140 07/31/2020 0823   K 4.6 07/31/2020 0823   CL 106 07/31/2020 0823   CO2 24 07/31/2020 0823   GLUCOSE 85 07/31/2020 0823   BUN 13 07/31/2020 0823   CREATININE 0.95 07/31/2020 0823   CALCIUM 9.3 07/31/2020 0823   PROT 6.8 07/31/2020 0823   AST 15 07/31/2020 0823   ALT 14 07/31/2020 0823   BILITOT 0.7 07/31/2020 0823   GFRNONAA 70 07/31/2020 0823   GFRAA 82 07/31/2020 0823       Component Value Date/Time   WBC 9.2 08/02/2020 1344   RBC 5.27 (H) 08/02/2020 1344   HGB 15.9 (H) 08/02/2020 1344   HCT 47.3 (H) 08/02/2020 1344   PLT 388 08/02/2020 1344   MCV 89.8 08/02/2020 1344   MCH 30.2 08/02/2020 1344   MCHC 33.6 08/02/2020 1344   RDW 12.0 08/02/2020 1344   LYMPHSABS 3,110 08/02/2020 1344   EOSABS 570 (H) 08/02/2020 1344   BASOSABS 74 08/02/2020 1344    No results found for: POCLITH, LITHIUM   No results found for: PHENYTOIN, PHENOBARB, VALPROATE, CBMZ   .res Assessment: Plan:      Plan:  PDMP reviewed  1. Celexa 40mg  daily 2. Ambien 10mg  daily 3. Xanax 0.25mg  daily to three times daily 4. Add Abilify 2mg  daily 6. D/C Adderall XR 30mg  every morning.   [facility] ADHD test 37/58 ADHD possible  RTC 4 weeks  Patient advised to contact office with any questions, adverse effects, or acute worsening in signs and symptoms.  Discussed potential benefits, risks, and side effects of stimulants with patient to include increased heart rate, palpitations, insomnia, increased anxiety, increased irritability, or decreased appetite.  Instructed patient to contact  office if experiencing any significant tolerability issues.  Discussed potential benefits, risk, and side effects of benzodiazepines to include potential risk of tolerance and dependence, as well as possible drowsiness.  Advised patient not to drive if experiencing drowsiness and to take lowest possible effective dose to minimize risk of dependence and tolerance   There are no diagnoses linked to this encounter.   Please see After Visit Summary for patient specific instructions.  Future Appointments  Date Time Provider Department Center  01/25/2021  8:15 AM [facility] None  01/31/2021 10:00 AM Ngetich, Dinah C, NP [facility] None    No orders of the defined types were placed in this encounter.   -------------------------------

## 2021-01-03 NOTE — Telephone Encounter (Signed)
Noted  

## 2021-01-03 NOTE — Telephone Encounter (Signed)
Pt scheduled for 2:00 today. Had canc.

## 2021-01-08 ENCOUNTER — Telehealth: Payer: Self-pay

## 2021-01-08 ENCOUNTER — Other Ambulatory Visit: Payer: Self-pay

## 2021-01-08 DIAGNOSIS — E785 Hyperlipidemia, unspecified: Secondary | ICD-10-CM

## 2021-01-08 DIAGNOSIS — I10 Essential (primary) hypertension: Secondary | ICD-10-CM

## 2021-01-08 NOTE — Telephone Encounter (Signed)
Patient called and states that she will be going to [facility] for blood work. Patient orders released, printed and placed in front of office. Patient will pick up papers. I wrote for lab results to be faxed back to [facility] with fax number.

## 2021-01-12 ENCOUNTER — Telehealth: Payer: Self-pay | Admitting: Adult Health

## 2021-01-12 NOTE — Telephone Encounter (Signed)
Pt left a message stating that she was told to call in two weeks to give an update on the abilfy 2 mg. She said that she is doing well on it  No side effects. She said that it does make her sleepy so she takes at night. She said that she would like to increase the diose but does have a hard time getting up and motivation to get out of the bed in the morning.. Please give her a call at [phone]

## 2021-01-12 NOTE — Telephone Encounter (Signed)
Giving it time will she feel better getting up?   Do you want her to increase after taking for 2 weeks. 2 mg Abilify

## 2021-01-12 NOTE — Telephone Encounter (Signed)
We can send in a 5mg  and have her break it in half for another week and then increase to the 5mg  if tolerating.

## 2021-01-17 ENCOUNTER — Other Ambulatory Visit: Payer: Self-pay

## 2021-01-17 ENCOUNTER — Telehealth: Payer: Self-pay | Admitting: Adult Health

## 2021-01-17 DIAGNOSIS — L650 Telogen effluvium: Secondary | ICD-10-CM | POA: Diagnosis not present

## 2021-01-17 MED ORDER — ARIPIPRAZOLE 5 MG PO TABS: 5.0000 mg | ORAL_TABLET | Freq: Every day | ORAL | 0 refills | Status: DC

## 2021-01-17 NOTE — Telephone Encounter (Signed)
That is not a medication I prescribe. Can call pharmacy to run an Scientist, research (physical sciences).

## 2021-01-17 NOTE — Telephone Encounter (Signed)
Yes according to the phone note 5mg  of Abilify should have been sent.You wanted her to break it in half for another week and then increase to the 5mg  if tolerating. I called and let her know and she wanted me to make sure she should cut it in half and not take the full 5 mg.She also mentioned in the previous note that she wanted something that would give her more energy. She wants to try SAM-e 400 mg if you approve.She also wanted me to mention since being on Abilify she does not have to take as much Xanax and the sexual problems have resolved.

## 2021-01-17 NOTE — Telephone Encounter (Signed)
What about the SAM-e 400 mg is it ok for her to take that?

## 2021-01-17 NOTE — Telephone Encounter (Signed)
Brendan called in again to inquire about dosing of her Abilify.  She has not been called back to tell her if she should increase the dose or what to do.  Please call to follow up with her.  Last note (01/12/21) indicates increasing to 5mg  but no prescription has been called in.  Pharmacy is [facility] [address] , [place]

## 2021-01-17 NOTE — Telephone Encounter (Signed)
Continue Abilify at 5mg  - script sent.

## 2021-01-18 DIAGNOSIS — K644 Residual hemorrhoidal skin tags: Secondary | ICD-10-CM | POA: Diagnosis not present

## 2021-01-18 DIAGNOSIS — Z01419 Encounter for gynecological examination (general) (routine) without abnormal findings: Secondary | ICD-10-CM | POA: Diagnosis not present

## 2021-01-18 DIAGNOSIS — F5231 Female orgasmic disorder: Secondary | ICD-10-CM | POA: Diagnosis not present

## 2021-01-18 DIAGNOSIS — N941 Unspecified dyspareunia: Secondary | ICD-10-CM | POA: Diagnosis not present

## 2021-01-19 DIAGNOSIS — L650 Telogen effluvium: Secondary | ICD-10-CM | POA: Diagnosis not present

## 2021-01-23 ENCOUNTER — Other Ambulatory Visit: Payer: Self-pay | Admitting: Adult Health

## 2021-01-23 DIAGNOSIS — G4700 Insomnia, unspecified: Secondary | ICD-10-CM

## 2021-01-24 ENCOUNTER — Ambulatory Visit: Payer: PRIVATE HEALTH INSURANCE | Admitting: Adult Health

## 2021-01-24 DIAGNOSIS — M170 Bilateral primary osteoarthritis of knee: Secondary | ICD-10-CM | POA: Diagnosis not present

## 2021-01-24 DIAGNOSIS — M542 Cervicalgia: Secondary | ICD-10-CM | POA: Diagnosis not present

## 2021-01-25 ENCOUNTER — Other Ambulatory Visit: Payer: BC Managed Care – PPO

## 2021-01-25 NOTE — Telephone Encounter (Signed)
Pt LM that she is down to 1 pill of ambien left. Please send to [facility] on [address].

## 2021-01-25 NOTE — Telephone Encounter (Signed)
Last filled 12/27/20 please approve

## 2021-01-30 ENCOUNTER — Other Ambulatory Visit: Payer: Self-pay

## 2021-01-30 ENCOUNTER — Ambulatory Visit: Payer: BC Managed Care – PPO | Admitting: Orthopedic Surgery

## 2021-01-30 ENCOUNTER — Encounter: Payer: Self-pay | Admitting: Orthopedic Surgery

## 2021-01-30 VITALS — BP 126/90 | HR 90 | Temp 97.9°F | Ht 63.0 in | Wt 145.0 lb

## 2021-01-30 DIAGNOSIS — R300 Dysuria: Secondary | ICD-10-CM | POA: Diagnosis not present

## 2021-01-30 DIAGNOSIS — J45990 Exercise induced bronchospasm: Secondary | ICD-10-CM | POA: Diagnosis not present

## 2021-01-30 DIAGNOSIS — Z8719 Personal history of other diseases of the digestive system: Secondary | ICD-10-CM | POA: Diagnosis not present

## 2021-01-30 DIAGNOSIS — Z9889 Other specified postprocedural states: Secondary | ICD-10-CM

## 2021-01-30 LAB — POCT URINALYSIS DIPSTICK
Bilirubin, UA: NEGATIVE
Blood, UA: NEGATIVE
Glucose, UA: NEGATIVE
Ketones, UA: NEGATIVE
Nitrite, UA: NEGATIVE
Protein, UA: NEGATIVE
Spec Grav, UA: 1.01 (ref 1.010–1.025)
Urobilinogen, UA: 0.2 E.U./dL
pH, UA: 6 (ref 5.0–8.0)

## 2021-01-30 MED ORDER — ALBUTEROL SULFATE HFA 108 (90 BASE) MCG/ACT IN AERS: 2.0000 | INHALATION_SPRAY | Freq: Four times a day (QID) | RESPIRATORY_TRACT | 0 refills | Status: DC | PRN

## 2021-01-30 MED ORDER — PHENAZOPYRIDINE HCL 100 MG PO TABS: 100.0000 mg | ORAL_TABLET | Freq: Three times a day (TID) | ORAL | 0 refills | Status: DC | PRN

## 2021-01-30 NOTE — Progress Notes (Signed)
Careteam: Patient Care Team: Ngetich, Donalee Citrin, NP as PCP - General (Family Medicine)  Seen by: Hazle Nordmann, AGNP-C  PLACE OF SERVICE:  [facility]  Advanced Directive information Does Patient Have a Medical Advance Directive?: No, Would patient like information on creating a medical advance directive?: No - Patient declined  Allergies  Allergen Reactions  . Benzocaine-Menthol   . Cholecalciferol   . Other     Other reaction(s): Unknown  . Skin Adhesives [Cyanoacrylate]   . Chlorhexidine Rash and Hives  . Codeine Other (See Comments)    Stomach problems patient reports  . Iodinated Diagnostic Agents Rash  . Penicillins Rash  . Sulfa Antibiotics Rash    Chief Complaint  Patient presents with  . Acute Visit    Patient c/o pelvic pain and possible UTI (pressure when urinating).      HPI: Patient is a 50 y.o. female seen today for acute visit for pelvic pressure.   Symptoms of pelvic pain and pressure/dysuria when urinating began a few days ago. Left kidney removed as a adult. Admits to not drinking enough water. She has tried to increase oral intake within past few days. Denies fever or flank pain. Admits to starting a new supplement to reduce carb absorption. Does not know if symptoms have exacerbated symptoms.   Reports left sided pain. She had hernia repair surgery when she was 50 years old. She believes the mesh is possibly causing some of her abdominal pain. Followed by [facility] for gallstone pain in the past. She has not contacted them about hernia issue. Pain intermittent, sometimes dependent with movement. She will take prn tylenol 325-650 mg for pain. LMP: hysterectomy about 10 years ago. LBM yesterday. Denies changes in bowel habits.   She has also been experiencing shortness of breath after exercising. Admits to wheezing at times. Requesting prn inhaler for episodes of wheezing.      Review of Systems:  Review of Systems  Constitutional: Negative  for chills, fever, malaise/fatigue and weight loss.  Respiratory: Positive for shortness of breath and wheezing. Negative for cough.   Cardiovascular: Negative for chest pain and leg swelling.  Gastrointestinal: Positive for abdominal pain. Negative for constipation, diarrhea, heartburn, nausea and vomiting.  Genitourinary: Positive for dysuria. Negative for flank pain, frequency and hematuria.  Psychiatric/Behavioral: Negative for depression. The patient is not nervous/anxious.     Past Medical History:  Diagnosis Date  . Anxiety   . Arthritis   . Chronic kidney disease 2008   Nephrectomy   . Depression   . High blood pressure   . High cholesterol   . Insomnia    Past Surgical History:  Procedure Laterality Date  . NEPHRECTOMY  2008   Done in [place]  . VAGINAL HYSTERECTOMY  2010   Dr. Ernie Hew   Social History:   reports that she has never smoked. She has never used smokeless tobacco. She reports current alcohol use of about 1.0 standard drink of alcohol per week. She reports that she does not use drugs.  Family History  Problem Relation Age of Onset  . Osteoporosis Mother   . Arthritis Mother     Medications: Patient's Medications  New Prescriptions   No medications on file  Previous Medications   ALPRAZOLAM (XANAX) 0.25 MG TABLET    Take 0.25 mg by mouth 3 (three) times daily as needed for anxiety.   ARIPIPRAZOLE (ABILIFY) 5 MG TABLET    Take 1 tablet (5 mg total) by  mouth daily.   CITALOPRAM (CELEXA) 40 MG TABLET    Take 1 tablet (40 mg total) by mouth daily. (prescribed by Psych)   CYANOCOBALAMIN (VITAMIN B12 PO)    Take 1 tablet by mouth as needed (when patient remembers to take).   GLUCOSAMINE-CHONDROITIN (MOVE FREE PO)    Take 1 tablet by mouth as needed (when patient remembers to take).   LOSARTAN (COZAAR) 50 MG TABLET    Take 1.5 tablets (75 mg total) by mouth daily. DX I10   METOPROLOL SUCCINATE (TOPROL-XL) 25 MG 24 HR TABLET    TAKE 1 TABLET BY MOUTH  EVERY DAY ALONG WITH THE 50MG  TO TOTAL 75MG  DAILY   METOPROLOL SUCCINATE (TOPROL-XL) 50 MG 24 HR TABLET    1 TABLET ONCE DAILY ALONG WITH 25MG  TO TOTAL 75MG  DAILY. TAKE WITH OR IMMEDIATELY FOLLOWING A MEAL   TESTOSTERONE CYPIONATE (DEPOTESTOSTERONE CYPIONATE) 200 MG/ML INJECTION    Inject 0.5 mg into the muscle every 7 (seven) days. No bottle shown for this medication   VITAMIN D PO    Take 1 tablet by mouth as needed (when patient remembers to take).   ZOLPIDEM (AMBIEN) 10 MG TABLET    TAKE 1 TABLET BY MOUTH EVERYDAY AT BEDTIME  Modified Medications   No medications on file  Discontinued Medications   ALPRAZOLAM (XANAX) 0.25 MG TABLET    Take one tablet by mouth three times daily.   AMPHETAMINE-DEXTROAMPHETAMINE (ADDERALL XR) 30 MG 24 HR CAPSULE    Take 1 capsule (30 mg total) by mouth daily.   ARIPIPRAZOLE (ABILIFY) 2 MG TABLET    Take 1 tablet (2 mg total) by mouth daily.   FENOFIBRATE (TRICOR) 145 MG TABLET    TAKE 1 TABLET BY MOUTH EVERY DAY   FLUTICASONE (CUTIVATE) 0.05 % CREAM    Apply 1 application topically 2 (two) times daily.   TRIAMCINOLONE (KENALOG) 0.1 %    Apply 1 application topically 2 (two) times daily.    Physical Exam:  Vitals:   01/30/21 1408  BP: 126/90  Pulse: 90  Temp: 97.9 F (36.6 C)  TempSrc: Temporal  SpO2: 98%  Weight: 145 lb (65.8 kg)  Height: 5\' 3"  (1.6 m)   Body mass index is 25.69 kg/m. Wt Readings from Last 3 Encounters:  01/30/21 145 lb (65.8 kg)  08/22/20 154 lb 6.4 oz (70 kg)  08/02/20 154 lb 12.8 oz (70.2 kg)    Physical Exam Vitals reviewed.  Constitutional:      General: She is not in acute distress. HENT:     Head: Normocephalic.  Cardiovascular:     Rate and Rhythm: Normal rate and regular rhythm.     Pulses: Normal pulses.     Heart sounds: Normal heart sounds. No murmur heard.   Pulmonary:     Effort: Pulmonary effort is normal. No respiratory distress.     Breath sounds: Normal breath sounds. No wheezing.  Abdominal:      General: Bowel sounds are normal. There is no distension.     Palpations: Abdomen is soft. There is no mass.     Tenderness: There is no abdominal tenderness.     Hernia: A hernia is present.     Comments: Left side of abdomen with bulge. Overall abdomen asymmetrical. Abdomen soft, no tenderness noted. Surgical scars present. Bowel sounds active.   Skin:    General: Skin is warm and dry.     Capillary Refill: Capillary refill takes less than 2 seconds.  Neurological:  General: No focal deficit present.     Mental Status: She is alert and oriented to person, place, and time.  Psychiatric:        Mood and Affect: Mood normal.        Behavior: Behavior normal.     Labs reviewed: Basic Metabolic Panel: Recent Labs    07/31/20 0823  NA 140  K 4.6  CL 106  CO2 24  GLUCOSE 85  BUN 13  CREATININE 0.95  CALCIUM 9.3  TSH 1.66   Liver Function Tests: Recent Labs    07/31/20 0823  AST 15  ALT 14  BILITOT 0.7  PROT 6.8   No results for input(s): LIPASE, AMYLASE in the last 8760 hours. No results for input(s): AMMONIA in the last 8760 hours. CBC: Recent Labs    07/31/20 0823 08/02/20 1344  WBC 12.5* 9.2  NEUTROABS 7,125 4,692  HGB 15.8* 15.9*  HCT 47.5* 47.3*  MCV 91.7 89.8  PLT 393 388   Lipid Panel: Recent Labs    07/31/20 0823  CHOL 157  HDL 38*  LDLCALC 88  TRIG 606*  CHOLHDL 4.1   TSH: Recent Labs    07/31/20 0823  TSH 1.66   A1C: No results found for: HGBA1C   Assessment/Plan 1. Dysuria - reports lower abdomen pressure and dysuria - small leukocytes present on UA - POC Urinalysis Dipstick - Culture, Urine - phenazopyridine (PYRIDIUM) 100 MG tablet; Take 1 tablet (100 mg total) by mouth 3 (three) times daily as needed for pain.  Dispense: 10 tablet; Refill: 0 - encourage oral hydration with water - advised to stop new supplement  2. History of hernia surgery - history of multiple abdominal surgeries including hernia repair at age 51 and  hysterectomy 10 years ago - abdomen asymmetrical, leaning to left side, non tender, no mass palpated - no changes to bowel habits at this time - followed by [facility] - advised to schedule f/u with general surgery  3. Exercise-induced asthma - reports some wheezing and sob after exercising - albuterol (VENTOLIN HFA) 108 (90 Base) MCG/ACT inhaler; Inhale 2 puffs into the lungs every 6 (six) hours as needed for wheezing or shortness of breath Dispense: 8 g; Refill: 0  Total time: 25 minutes. Greater than 50% of total time spent doing patient education and coordination of care regarding abdominal pain, and health maintence   Next appt: Visit date not found Kearstin Learn Scherry Ran  [facility] [phone]

## 2021-01-30 NOTE — Patient Instructions (Addendum)
Drink lots of water- helps with UTI and constipation Stop taking diabetic supplement May use albuterol for shortness of breath after exercising Contact [facility] about left sided hernia May take tylenol 325-650 mg for pelvic pain    Dysuria Dysuria is pain or discomfort during urination. The pain or discomfort may be felt in the part of the body that drains urine from the bladder (urethra) or in the surrounding tissue of the genitals. The pain may also be felt in the groin area, lower abdomen, or lower back. You may have to urinate frequently or have the sudden feeling that you have to urinate (urgency). Dysuria can affect anyone, but it is more common in females. Dysuria can be caused by many different things, including:  Urinary tract infection.  Kidney stones or bladder stones.  Certain STIs (sexually transmitted infections), such as chlamydia.  Dehydration.  Inflammation of the tissues of the vagina.  Use of certain medicines.  Use of certain soaps or scented products that cause irritation. Follow these instructions at home: Medicines  Take over-the-counter and prescription medicines only as told by your health care provider.  If you were prescribed an antibiotic medicine, take it as told by your health care provider. Do not stop taking the antibiotic even if you start to feel better. Eating and drinking  Drink enough fluid to keep your urine pale yellow.  Avoid caffeinated beverages, tea, and alcohol. These beverages can irritate the bladder and make dysuria worse. In males, alcohol may irritate the prostate.   General instructions  Watch your condition for any changes.  Urinate often. Avoid holding urine for long periods of time.  If you are female, you should wipe from front to back after urinating or having a bowel movement. Use each piece of toilet paper only once.  Empty your bladder after sex.  Keep all follow-up visits. This is important.  If  you had any tests done to find the cause of dysuria, it is up to you to get your test results. Ask your health care provider, or the department that is doing the test, when your results will be ready. Contact a health care provider if:  You have a fever.  You develop pain in your back or sides.  You have nausea or vomiting.  You have blood in your urine.  You are not urinating as often as you usually do. Get help right away if:  Your pain is severe and not relieved with medicines.  You cannot eat or drink without vomiting.  You are confused.  You have a rapid heartbeat while resting.  You have shaking or chills.  You feel extremely weak. Summary  Dysuria is pain or discomfort while urinating. Many different conditions can lead to dysuria.  If you have dysuria, you may have to urinate frequently or have the sudden feeling that you have to urinate (urgency).  Watch your condition for any changes. Keep all follow-up visits.  Make sure that you urinate often and drink enough fluid to keep your urine pale yellow. This information is not intended to replace advice given to you by your health care provider. Make sure you discuss any questions you have with your health care provider. Document Revised: 04/07/2020 Document Reviewed: 04/07/2020 Elsevier Patient Education  2021 [employer].

## 2021-01-31 ENCOUNTER — Encounter: Payer: Self-pay | Admitting: Adult Health

## 2021-01-31 ENCOUNTER — Ambulatory Visit: Payer: BC Managed Care – PPO | Admitting: Family

## 2021-01-31 ENCOUNTER — Ambulatory Visit (INDEPENDENT_AMBULATORY_CARE_PROVIDER_SITE_OTHER): Payer: PRIVATE HEALTH INSURANCE | Admitting: Adult Health

## 2021-01-31 DIAGNOSIS — F411 Generalized anxiety disorder: Secondary | ICD-10-CM

## 2021-01-31 DIAGNOSIS — F410 Panic disorder [episodic paroxysmal anxiety] without agoraphobia: Secondary | ICD-10-CM

## 2021-01-31 DIAGNOSIS — F331 Major depressive disorder, recurrent, moderate: Secondary | ICD-10-CM | POA: Diagnosis not present

## 2021-01-31 DIAGNOSIS — G4700 Insomnia, unspecified: Secondary | ICD-10-CM

## 2021-01-31 DIAGNOSIS — L700 Acne vulgaris: Secondary | ICD-10-CM | POA: Diagnosis not present

## 2021-01-31 DIAGNOSIS — L650 Telogen effluvium: Secondary | ICD-10-CM | POA: Diagnosis not present

## 2021-01-31 MED ORDER — ALPRAZOLAM 0.25 MG PO TABS: 0.2500 mg | ORAL_TABLET | Freq: Three times a day (TID) | ORAL | 2 refills | Status: DC | PRN

## 2021-01-31 MED ORDER — ZOLPIDEM TARTRATE 10 MG PO TABS: ORAL_TABLET | ORAL | 2 refills | Status: DC

## 2021-01-31 MED ORDER — ARIPIPRAZOLE 5 MG PO TABS: ORAL_TABLET | ORAL | 5 refills | Status: DC

## 2021-01-31 NOTE — Progress Notes (Signed)
Christina Leonard [id number] 19-Oct-1970 50 y.o.  Subjective:   Patient ID:  Christina Leonard is a 50 y.o. (DOB 05-14-1971) female.  Chief Complaint: No chief complaint on file.   HPI Christina Leonard presents to the office today for follow-up of MDD, GAD, insomnia, and panic attacks.  Describes mood today as "ok". Pleasant. Mood symptoms - denies depression. Decreased anxiety. Irritable at times. Stating "I have the same problems, but I'm dealing with them better". Feels like addition of Abilify has been helpful, but would like to increase dose. Marriage is better - husband feels like she needs to see a therapist for PTSD. Having issues with blood pressure. Varying interest and motivation. Taking medications as prescribed.  Energy levels improved. Active, does not have a regular exercise routine. Enjoys some usual interests and activities. Married. Lives with husband x 25 years - 3 dog and 2 cats. Two daughters 12 and 67. Daughters are in [place]. Moved to [place] from [place] for husband's job. Spending time with family. Appetite adequate. Weight stable - 142 pounds. Sleeps well most nights. Averages 7 hours with Ambien. Focus and concentration difficulties at times. Completing tasks. Managing aspects of household. Unemployed. Denies SI or HI.  Denies AH or VH.  One kidney x 12 years ago - working with PCP to manage BP.  Review of Systems:  Review of Systems  Musculoskeletal: Negative for gait problem.  Neurological: Negative for tremors.  Psychiatric/Behavioral:       Please refer to HPI    Medications: I have reviewed the patient's current medications.  Current Outpatient Medications  Medication Sig Dispense Refill  . albuterol (VENTOLIN HFA) 108 (90 Base) MCG/ACT inhaler Inhale 2 puffs into the lungs every 6 (six) hours as needed for wheezing or shortness of breath. 8 g 0  . ALPRAZolam (XANAX) 0.25 MG tablet Take 1 tablet (0.25 mg total) by mouth 3 (three) times daily as needed for anxiety. 90  tablet 2  . ARIPiprazole (ABILIFY) 5 MG tablet Take one and 1/2 tablets daily. 45 tablet 5  . citalopram (CELEXA) 40 MG tablet Take 1 tablet (40 mg total) by mouth daily. (prescribed by Psych) 90 tablet 1  . Cyanocobalamin (VITAMIN B12 PO) Take 1 tablet by mouth as needed (when patient remembers to take).    . Glucosamine-Chondroitin (MOVE FREE PO) Take 1 tablet by mouth as needed (when patient remembers to take).    Marland Kitchen losartan (COZAAR) 50 MG tablet Take 1.5 tablets (75 mg total) by mouth daily. DX I10 45 tablet 5  . metoprolol succinate (TOPROL-XL) 25 MG 24 hr tablet TAKE 1 TABLET BY MOUTH EVERY DAY ALONG WITH THE 50MG  TO TOTAL 75MG  DAILY 90 tablet 1  . metoprolol succinate (TOPROL-XL) 50 MG 24 hr tablet 1 TABLET ONCE DAILY ALONG WITH 25MG  TO TOTAL 75MG  DAILY. TAKE WITH OR IMMEDIATELY FOLLOWING A MEAL 90 tablet 1  . phenazopyridine (PYRIDIUM) 100 MG tablet Take 1 tablet (100 mg total) by mouth 3 (three) times daily as needed for pain. 10 tablet 0  . testosterone cypionate (DEPOTESTOSTERONE CYPIONATE) 200 MG/ML injection Inject 0.5 mg into the muscle every 7 (seven) days. No bottle shown for this medication    . VITAMIN D PO Take 1 tablet by mouth as needed (when patient remembers to take).    zolpidem (AMBIEN) 10 MG tablet TAKE 1 TABLET BY MOUTH EVERYDAY AT BEDTIME 30 tablet 2   No current facility-administered medications for this visit.    Medication Side Effects: None  Allergies:  Allergies  Allergen Reactions  . Benzocaine-Menthol   . Cholecalciferol   . Other     Other reaction(s): Unknown  . Skin Adhesives [Cyanoacrylate]   . Chlorhexidine Rash and Hives  . Codeine Other (See Comments)    Stomach problems patient reports  . Iodinated Diagnostic Agents Rash  . Penicillins Rash  . Sulfa Antibiotics Rash    Past Medical History:  Diagnosis Date  . Anxiety   . Arthritis   . Chronic kidney disease 2008   Nephrectomy   . Depression   . High blood pressure   . High  cholesterol   . Insomnia     Past Medical History, Surgical history, Social history, and Family history were reviewed and updated as appropriate.   Please see review of systems for further details on the patient's review from today.   Objective:   Physical Exam:  There were no vitals taken for this visit.  Physical Exam Constitutional:      General: She is not in acute distress. Musculoskeletal:        General: No deformity.  Neurological:     Mental Status: She is alert and oriented to person, place, and time.     Coordination: Coordination normal.  Psychiatric:        Attention and Perception: Attention and perception normal. She does not perceive auditory or visual hallucinations.        Mood and Affect: Mood normal. Mood is not anxious or depressed. Affect is not labile, blunt, angry or inappropriate.        Speech: Speech normal.        Behavior: Behavior normal.        Thought Content: Thought content normal. Thought content is not paranoid or delusional. Thought content does not include homicidal or suicidal ideation. Thought content does not include homicidal or suicidal plan.        Cognition and Memory: Cognition and memory normal.        Judgment: Judgment normal.     Comments: Insight intact     Lab Review:     Component Value Date/Time   NA 140 07/31/2020 0823   K 4.6 07/31/2020 0823   CL 106 07/31/2020 0823   CO2 24 07/31/2020 0823   GLUCOSE 85 07/31/2020 0823   BUN 13 07/31/2020 0823   CREATININE 0.95 07/31/2020 0823   CALCIUM 9.3 07/31/2020 0823   PROT 6.8 07/31/2020 0823   AST 15 07/31/2020 0823   ALT 14 07/31/2020 0823   BILITOT 0.7 07/31/2020 0823   GFRNONAA 70 07/31/2020 0823   GFRAA 82 07/31/2020 0823       Component Value Date/Time   WBC 9.2 08/02/2020 1344   RBC 5.27 (H) 08/02/2020 1344   HGB 15.9 (H) 08/02/2020 1344   HCT 47.3 (H) 08/02/2020 1344   PLT 388 08/02/2020 1344   MCV 89.8 08/02/2020 1344   MCH 30.2 08/02/2020 1344   MCHC  33.6 08/02/2020 1344   RDW 12.0 08/02/2020 1344   LYMPHSABS 3,110 08/02/2020 1344   EOSABS 570 (H) 08/02/2020 1344   BASOSABS 74 08/02/2020 1344    No results found for: POCLITH, LITHIUM   No results found for: PHENYTOIN, PHENOBARB, VALPROATE, CBMZ   .res Assessment: Plan:     Plan:  PDMP reviewed  1. Celexa 40mg  daily 2. Ambien 10mg  daily 3. Xanax 0.25mg  daily to three times daily - not using 4. Increase Abilify 5mg  to 7.5mg  daily     [facility] ADHD test  37/58 ADHD possible  RTC 4 weeks  Patient advised to contact office with any questions, adverse effects, or acute worsening in signs and symptoms.  Discussed potential benefits, risks, and side effects of stimulants with patient to include increased heart rate, palpitations, insomnia, increased anxiety, increased irritability, or decreased appetite.  Instructed patient to contact office if experiencing any significant tolerability issues.  Discussed potential benefits, risk, and side effects of benzodiazepines to include potential risk of tolerance and dependence, as well as possible drowsiness.  Advised patient not to drive if experiencing drowsiness and to take lowest possible effective dose to minimize risk of dependence and tolerance    Diagnoses and all orders for this visit:  Major depressive disorder, recurrent episode, moderate (HCC) -     ARIPiprazole (ABILIFY) 5 MG tablet; Take one and 1/2 tablets daily.  Insomnia, unspecified type -     zolpidem (AMBIEN) 10 MG tablet; TAKE 1 TABLET BY MOUTH EVERYDAY AT BEDTIME  Panic attacks -     ALPRAZolam (XANAX) 0.25 MG tablet; Take 1 tablet (0.25 mg total) by mouth 3 (three) times daily as needed for anxiety.  Generalized anxiety disorder -     ALPRAZolam (XANAX) 0.25 MG tablet; Take 1 tablet (0.25 mg total) by mouth 3 (three) times daily as needed for anxiety.     Please see After Visit Summary for patient specific instructions.  Future Appointments  Date  Time Provider Department Center  02/19/2021 10:00 AM Ngetich, Donalee Citrin, NP [facility] None    No orders of the defined types were placed in this encounter.   -------------------------------

## 2021-02-01 LAB — URINE CULTURE
MICRO NUMBER:: 11932112
Result:: NO GROWTH
SPECIMEN QUALITY:: ADEQUATE

## 2021-02-02 DIAGNOSIS — R519 Headache, unspecified: Secondary | ICD-10-CM | POA: Diagnosis not present

## 2021-02-02 DIAGNOSIS — M5412 Radiculopathy, cervical region: Secondary | ICD-10-CM | POA: Diagnosis not present

## 2021-02-02 DIAGNOSIS — M25561 Pain in right knee: Secondary | ICD-10-CM | POA: Diagnosis not present

## 2021-02-02 DIAGNOSIS — M25562 Pain in left knee: Secondary | ICD-10-CM | POA: Diagnosis not present

## 2021-02-09 DIAGNOSIS — M25561 Pain in right knee: Secondary | ICD-10-CM | POA: Diagnosis not present

## 2021-02-09 DIAGNOSIS — M25562 Pain in left knee: Secondary | ICD-10-CM | POA: Diagnosis not present

## 2021-02-09 DIAGNOSIS — M5412 Radiculopathy, cervical region: Secondary | ICD-10-CM | POA: Diagnosis not present

## 2021-02-09 DIAGNOSIS — R519 Headache, unspecified: Secondary | ICD-10-CM | POA: Diagnosis not present

## 2021-02-12 DIAGNOSIS — M25562 Pain in left knee: Secondary | ICD-10-CM | POA: Diagnosis not present

## 2021-02-12 DIAGNOSIS — R519 Headache, unspecified: Secondary | ICD-10-CM | POA: Diagnosis not present

## 2021-02-12 DIAGNOSIS — M5412 Radiculopathy, cervical region: Secondary | ICD-10-CM | POA: Diagnosis not present

## 2021-02-12 DIAGNOSIS — M25561 Pain in right knee: Secondary | ICD-10-CM | POA: Diagnosis not present

## 2021-02-13 DIAGNOSIS — I10 Essential (primary) hypertension: Secondary | ICD-10-CM | POA: Diagnosis not present

## 2021-02-13 DIAGNOSIS — E785 Hyperlipidemia, unspecified: Secondary | ICD-10-CM | POA: Diagnosis not present

## 2021-02-15 ENCOUNTER — Other Ambulatory Visit: Payer: Self-pay | Admitting: Family

## 2021-02-19 ENCOUNTER — Other Ambulatory Visit: Payer: Self-pay

## 2021-02-19 ENCOUNTER — Encounter: Payer: Self-pay | Admitting: Family

## 2021-02-19 ENCOUNTER — Ambulatory Visit: Payer: BC Managed Care – PPO | Admitting: Family

## 2021-02-19 VITALS — BP 140/88 | HR 60 | Temp 97.3°F | Resp 16 | Ht 63.0 in | Wt 145.4 lb

## 2021-02-19 DIAGNOSIS — I10 Essential (primary) hypertension: Secondary | ICD-10-CM

## 2021-02-19 DIAGNOSIS — F411 Generalized anxiety disorder: Secondary | ICD-10-CM

## 2021-02-19 DIAGNOSIS — E785 Hyperlipidemia, unspecified: Secondary | ICD-10-CM

## 2021-02-19 DIAGNOSIS — R1010 Upper abdominal pain, unspecified: Secondary | ICD-10-CM | POA: Diagnosis not present

## 2021-02-19 DIAGNOSIS — R519 Headache, unspecified: Secondary | ICD-10-CM | POA: Diagnosis not present

## 2021-02-19 DIAGNOSIS — M25561 Pain in right knee: Secondary | ICD-10-CM | POA: Diagnosis not present

## 2021-02-19 DIAGNOSIS — M25562 Pain in left knee: Secondary | ICD-10-CM | POA: Diagnosis not present

## 2021-02-19 DIAGNOSIS — M5412 Radiculopathy, cervical region: Secondary | ICD-10-CM | POA: Diagnosis not present

## 2021-02-19 DIAGNOSIS — J45990 Exercise induced bronchospasm: Secondary | ICD-10-CM | POA: Diagnosis not present

## 2021-02-19 DIAGNOSIS — Z1211 Encounter for screening for malignant neoplasm of colon: Secondary | ICD-10-CM

## 2021-02-19 MED ORDER — LOSARTAN POTASSIUM 100 MG PO TABS: 100.0000 mg | ORAL_TABLET | Freq: Every day | ORAL | 1 refills | Status: DC

## 2021-02-19 MED ORDER — ALBUTEROL SULFATE HFA 108 (90 BASE) MCG/ACT IN AERS: 2.0000 | INHALATION_SPRAY | Freq: Four times a day (QID) | RESPIRATORY_TRACT | 3 refills | Status: AC | PRN

## 2021-02-19 NOTE — Progress Notes (Signed)
Provider: Richarda Blade FNP-C   Jasir Rother, Donalee Citrin, NP  Patient Care Team: Milen Lengacher, Donalee Citrin, NP as PCP - General (Family Medicine)  Extended Emergency Contact Information Primary Emergency Contact: udell, blasingame Mobile Phone: [phone] Relation: Spouse  Code Status:  Full Code  Goals of care: Advanced Directive information Advanced Directives 02/19/2021  Does Patient Have a Medical Advance Directive? No  Would patient like information on creating a medical advance directive? No - Patient declined     Chief Complaint  Patient presents with   Medical Management of Chronic Issues    6 month follow up.   Health Maintenance    Discuss the need for Pap smear, Colonoscopy, and Hepatitis C Screening.    Immunizations    Discuss the need for Tetanus Vaccine, Shingrix Vaccine, and Covid Booster.     HPI:  Pt is a 50 y.o. female seen today for 6 months follow up for medical management of chronic diseases.   States eating better.Had stopped eating meat but lost her hair so she resumed.  Left upper side pain and swelling has worsen.states has appointment with general surgery for hernia evaluation.she is concerned about worsening swelling when she stands.would like a sooner appointment.she denies any signs of obstruction.  She is due for Tdap vaccine ,Shingrix vaccine and COVID-19 booster but declines states concerns with allergies and reaction to medication.  Will schedule for Pap smear with her gyn.  Due for Colonoscopy requires referral to GI.reports no symptoms.  Hep C screening - states no high risk issues.   Also request weight loss medication though her weight is stable at 145 lbs since last visit with a BMI of 25.76 she is status post liposuction.  She continues to follow up with Psychiatrist for anxiety and depression.states feeling much better after Abilify was added to her medication.   States working with Physical therapy for right knee pain.    Past Medical  History:  Diagnosis Date   Anxiety    Arthritis    Chronic kidney disease 2008   Nephrectomy    Depression    High blood pressure    High cholesterol    Insomnia    Past Surgical History:  Procedure Laterality Date   NEPHRECTOMY  2008   Done in [place]   VAGINAL HYSTERECTOMY  2010   Dr. Ernie Hew    Allergies  Allergen Reactions   Benzocaine-Menthol    Cholecalciferol    Other     Other reaction(s): Unknown   Skin Adhesives [Cyanoacrylate]    Chlorhexidine Rash and Hives   Codeine Other (See Comments)    Stomach problems patient reports   Iodinated Diagnostic Agents Rash   Penicillins Rash   Sulfa Antibiotics Rash    Allergies as of 02/19/2021       Reactions   Benzocaine-menthol    Cholecalciferol    Other    Other reaction(s): Unknown   Skin Adhesives [cyanoacrylate]    Chlorhexidine Rash, Hives   Codeine Other (See Comments)   Stomach problems patient reports   Iodinated Diagnostic Agents Rash   Penicillins Rash   Sulfa Antibiotics Rash        Medication List        Accurate as of February 19, 2021 10:10 AM. If you have any questions, ask your nurse or doctor.          albuterol 108 (90 Base) MCG/ACT inhaler Commonly known as: VENTOLIN HFA Inhale 2 puffs into the lungs every 6 (six)  hours as needed for wheezing or shortness of breath.   ALPRAZolam 0.25 MG tablet Commonly known as: XANAX Take 1 tablet (0.25 mg total) by mouth 3 (three) times daily as needed for anxiety.   ARIPiprazole 5 MG tablet Commonly known as: Abilify Take one and 1/2 tablets daily.   BIOTIN PO Take 2,500 mg by mouth daily. [facility].   citalopram 40 MG tablet Commonly known as: CELEXA Take 1 tablet (40 mg total) by mouth daily. (prescribed by Psych)   losartan 50 MG tablet Commonly known as: COZAAR Take 1.5 tablets (75 mg total) by mouth daily. DX I10   metoprolol succinate 50 MG 24 hr tablet Commonly known as: TOPROL-XL 1 TABLET ONCE DAILY  ALONG WITH 25MG  TO TOTAL 75MG  DAILY. TAKE WITH OR IMMEDIATELY FOLLOWING A MEAL   metoprolol succinate 25 MG 24 hr tablet Commonly known as: TOPROL-XL TAKE 1 TABLET BY MOUTH EVERY DAY ALONG WITH THE 50MG  TO TOTAL 75MG  DAILY   MOVE FREE PO Take 1 tablet by mouth as needed (when patient remembers to take).   OVER THE COUNTER MEDICATION Take 1 capsule by mouth daily. [facility]   OVER THE COUNTER MEDICATION Take 1,000 mg by mouth daily. Tumeric   phenazopyridine 100 MG tablet Commonly known as: Pyridium Take 1 tablet (100 mg total) by mouth 3 (three) times daily as needed for pain.   testosterone cypionate 200 MG/ML injection Commonly known as: DEPOTESTOSTERONE CYPIONATE Inject 0.5 mg into the muscle every 7 (seven) days. No bottle shown for this medication   VITAMIN B12 PO Take 1 tablet by mouth as needed (when patient remembers to take).   VITAMIN D PO Take 1 tablet by mouth as needed (when patient remembers to take).   zolpidem 10 MG tablet Commonly known as: AMBIEN TAKE 1 TABLET BY MOUTH EVERYDAY AT BEDTIME        Review of Systems  Constitutional:  Negative for appetite change, chills, fatigue, fever and unexpected weight change.  HENT:  Negative for congestion, dental problem, ear discharge, ear pain, facial swelling, hearing loss, nosebleeds, postnasal drip, rhinorrhea, sinus pressure, sinus pain, sneezing, sore throat, tinnitus and trouble swallowing.   Eyes:  Negative for pain, discharge, redness, itching and visual disturbance.  Respiratory:  Negative for cough, chest tightness, shortness of breath and wheezing.   Cardiovascular:  Negative for chest pain, palpitations and leg swelling.  Gastrointestinal:  Negative for abdominal distention, abdominal pain, blood in stool, constipation, diarrhea, nausea and vomiting.  Endocrine: Negative for cold intolerance, heat intolerance, polydipsia, polyphagia and polyuria.  Genitourinary:  Negative for difficulty  urinating, dysuria, flank pain, frequency and urgency.  Musculoskeletal:  Negative for arthralgias, back pain, gait problem, joint swelling, myalgias, neck pain and neck stiffness.  Skin:  Negative for color change, pallor, rash and wound.  Neurological:  Negative for dizziness, syncope, speech difficulty, weakness, light-headedness, numbness and headaches.  Hematological:  Does not bruise/bleed easily.  Psychiatric/Behavioral:  Negative for agitation, behavioral problems, confusion, hallucinations, self-injury, sleep disturbance and suicidal ideas. The patient is not nervous/anxious.        Follows up with Psychiatry    Immunization History  Administered Date(s) Administered   PFIZER(Purple Top)SARS-COV-2 Vaccination 05/26/2020, 06/16/2020   Pertinent  Health Maintenance Due  Topic Date Due   PAP SMEAR-Modifier  Never done   COLONOSCOPY (Pts [id number] Insurance coverage will need to be confirmed)  Never done   INFLUENZA VACCINE  04/09/2021   Fall Risk  02/19/2021 08/22/2020  07/31/2020 04/14/2020 01/19/2020  Falls in the past year? 0 0 0 0 0  Number falls in past yr: 0 0 0 0 0  Injury with Fall? 0 0 0 0 0   Functional Status Survey:    Vitals:   02/19/21 0955  BP: 140/88  Pulse: 60  Resp: 16  Temp: (!) 97.3 F (36.3 C)  SpO2: 98%  Weight: 145 lb 6.4 oz (66 kg)  Height: 5\' 3"  (1.6 m)   Body mass index is 25.76 kg/m. Physical Exam Vitals reviewed.  Constitutional:      General: She is not in acute distress.    Appearance: Normal appearance. She is normal weight. She is not ill-appearing or diaphoretic.  HENT:     Head: Normocephalic.     Right Ear: Tympanic membrane, ear canal and external ear normal. There is no impacted cerumen.     Left Ear: Tympanic membrane, ear canal and external ear normal. There is no impacted cerumen.     Nose: Nose normal. No congestion or rhinorrhea.     Mouth/Throat:     Mouth: Mucous membranes are moist.     Pharynx: Oropharynx is clear. No  oropharyngeal exudate or posterior oropharyngeal erythema.  Eyes:     General: No scleral icterus.       Right eye: No discharge.        Left eye: No discharge.     Extraocular Movements: Extraocular movements intact.     Conjunctiva/sclera: Conjunctivae normal.     Pupils: Pupils are equal, round, and reactive to light.  Neck:     Vascular: No carotid bruit.  Cardiovascular:     Rate and Rhythm: Normal rate and regular rhythm.     Pulses: Normal pulses.     Heart sounds: Normal heart sounds. No murmur heard.   No friction rub. No gallop.  Pulmonary:     Effort: Pulmonary effort is normal. No respiratory distress.     Breath sounds: Normal breath sounds. No wheezing, rhonchi or rales.  Chest:     Chest wall: No tenderness.  Abdominal:     General: Bowel sounds are normal. There is no distension.     Palpations: Abdomen is soft. There is no mass.     Tenderness: There is no abdominal tenderness. There is no right CVA tenderness, left CVA tenderness, guarding or rebound.     Comments: Bulging swelling on left upper quadrant which disappear with lying down  Musculoskeletal:        General: No swelling or tenderness. Normal range of motion.     Cervical back: Normal range of motion. No rigidity or tenderness.     Right lower leg: No edema.     Left lower leg: No edema.  Lymphadenopathy:     Cervical: No cervical adenopathy.  Skin:    General: Skin is warm and dry.     Coloration: Skin is not pale.     Findings: No bruising, erythema, lesion or rash.  Neurological:     Mental Status: She is alert and oriented to person, place, and time.     Cranial Nerves: No cranial nerve deficit.     Sensory: No sensory deficit.     Motor: No weakness.     Coordination: Coordination normal.     Gait: Gait normal.  Psychiatric:        Mood and Affect: Mood is anxious.        Speech: Speech normal.  Behavior: Behavior normal.        Thought Content: Thought content normal.         Judgment: Judgment normal.    Labs reviewed: Recent Labs    07/31/20 0823  NA 140  K 4.6  CL 106  CO2 24  GLUCOSE 85  BUN 13  CREATININE 0.95  CALCIUM 9.3   Recent Labs    07/31/20 0823  AST 15  ALT 14  BILITOT 0.7  PROT 6.8   Recent Labs    07/31/20 0823 08/02/20 1344  WBC 12.5* 9.2  NEUTROABS 7,125 4,692  HGB 15.8* 15.9*  HCT 47.5* 47.3*  MCV 91.7 89.8  PLT 393 388   Lab Results  Component Value Date   TSH 1.66 07/31/2020   No results found for: HGBA1C Lab Results  Component Value Date   CHOL 157 07/31/2020   HDL 38 (L) 07/31/2020   LDLCALC 88 07/31/2020   TRIG 214 (H) 07/31/2020   CHOLHDL 4.1 07/31/2020    Significant Diagnostic Results in last 30 days:  No results found.  Assessment/Plan  1. Exercise-induced asthma Symptoms controlled. Continue albuterol - albuterol (VENTOLIN HFA) 108 (90 Base) MCG/ACT inhaler; Inhale 2 puffs into the lungs every 6 (six) hours as needed for wheezing or shortness of breath.  Dispense: 8 g; Refill: 3  2. Essential hypertension B/p stable. continue current medication   3. Hyperlipidemia LDL goal <100 LDL 88 and TRG 214 tricor recommended but declines due to concerns for allergies and side effects. - dietary modification and exercise recommended    4. Pain of upper abdomen Has upcoming appointment with [place] general surgery.will request sooner appointment per patient's request bulging seems to be worsening with standing. - advised to go to ED if symptoms worsen or develops any signs of GI obstruction.   5. Generalized anxiety disorder Stable. Continue to follow up with Psychiatrist  Continue on Xanax   6. Colon cancer screening - Ambulatory referral to Gastroenterology   Family/ staff Communication: Reviewed plan of care with patient verbalized understanding   Labs/tests ordered: None   Next Appointment : 6 months for medical management of chronic issues.  Caesar Bookman, NP

## 2021-02-19 NOTE — Patient Instructions (Addendum)
-   referral place to Nutritionists specialist will call you . - Please increase Losartan to 100 mgt tablet daily may use two tablets of 50 mg until gone.

## 2021-02-20 ENCOUNTER — Telehealth: Payer: Self-pay | Admitting: *Deleted

## 2021-02-20 NOTE — Telephone Encounter (Signed)
Please call Dr.Ramirez office to see if patient's can be seen sooner due to worsening hernia symptoms.

## 2021-02-20 NOTE — Telephone Encounter (Signed)
Patient called and stated that Dinah wanted to know the name of the Dr. She is going to for her Hernia.   His name is Dr. Derrell Lolling [phone] Appointment: 03/08/2021

## 2021-02-21 ENCOUNTER — Telehealth: Payer: Self-pay | Admitting: *Deleted

## 2021-02-21 DIAGNOSIS — E785 Hyperlipidemia, unspecified: Secondary | ICD-10-CM

## 2021-02-21 NOTE — Telephone Encounter (Signed)
I am not comfort prescribing Orlistat due to side effects.mainly used for weight loss.I recommend follow up with hyperlipidemia clinic if unable to take Tricor.

## 2021-02-21 NOTE — Telephone Encounter (Signed)
Thank you.Please notify patient that she has been placed on waiting list for any cancellation.

## 2021-02-21 NOTE — Telephone Encounter (Signed)
Patient called and stated that it is time to reorder her cholesterol medication, Tricor. Patient is wanting to know if you would change it due to side effects, rash.   Patient is wanting to know if you would change it to Orlistat.   Please Advise.

## 2021-02-22 ENCOUNTER — Telehealth (INDEPENDENT_AMBULATORY_CARE_PROVIDER_SITE_OTHER): Payer: BC Managed Care – PPO | Admitting: Adult Health

## 2021-02-22 DIAGNOSIS — F411 Generalized anxiety disorder: Secondary | ICD-10-CM | POA: Diagnosis not present

## 2021-02-22 DIAGNOSIS — F909 Attention-deficit hyperactivity disorder, unspecified type: Secondary | ICD-10-CM

## 2021-02-22 DIAGNOSIS — F410 Panic disorder [episodic paroxysmal anxiety] without agoraphobia: Secondary | ICD-10-CM

## 2021-02-22 DIAGNOSIS — F331 Major depressive disorder, recurrent, moderate: Secondary | ICD-10-CM

## 2021-02-22 DIAGNOSIS — G4700 Insomnia, unspecified: Secondary | ICD-10-CM

## 2021-02-22 NOTE — Telephone Encounter (Signed)
Patient stated that she would like for you to refer her to the Clinic.   Patient also wants to know if you have received the bloodwork yet from [facility], just to see if she even needs to go to the Hyperlipidemia Clinic.   Please Advise.   *Labs are under "Media" dated 02/14/2021 (please Advise on labs)

## 2021-02-22 NOTE — Progress Notes (Signed)
Christina Leonard Restivo [id number] Sep 15, 1970 50 y.o.  Virtual Visit via Telephone Note  I connected with pt on 02/22/21 at 11:20 AM EDT by telephone and verified that I am speaking with the correct person using two identifiers.   I discussed the limitations, risks, security and privacy concerns of performing an evaluation and management service by telephone and the availability of in person appointments. I also discussed with the patient that there may be a patient responsible charge related to this service. The patient expressed understanding and agreed to proceed.   I discussed the assessment and treatment plan with the patient. The patient was provided an opportunity to ask questions and all were answered. The patient agreed with the plan and demonstrated an understanding of the instructions.   The patient was advised to call back or seek an in-person evaluation if the symptoms worsen or if the condition fails to improve as anticipated.  I provided 30 minutes of non-face-to-face time during this encounter.  The patient was located at home.  [facility]   Dorothyann Gibbsegina N Darria Corvera, NP   Subjective:   Patient ID:  Christina Leonard Stradley is a 50 y.o. (DOB Sep 15, 1970) female.  Chief Complaint: No chief complaint on file.   HPI Christina Leonard Ferraiolo presents for follow-up of MDD, GAD, insomnia, and panic attacks.  Describes mood today as "somewhat ok". Pleasant. Mood symptoms - feels irritated and depressed. Increased anxiety. Having difficulties with thinking. Unable to sit still. Body movements. Twitching. Feels restless. Irritable at nights. Symptoms started after increase in Abilify. Does not feel like the Abilify is agreeing with her. Did better at the 5mg  dose. Willing to stop medication and try something different with resolution of untoward side effects. She and husband doing well. Varying interest and motivation. Taking medications as prescribed.  Energy levels improved. Active,  does not have a regular exercise routine. Enjoys some usual interests and activities. Married. Lives with husband x 25 years - 3 dog and 2 cats. Two daughters [id number] and [id number]. Daughters are in [place]. Moved to [place] from [place] for husband's job. Spending time with family. Appetite adequate. Weight gain - 142 to 145 pounds. Sleeps well most nights. Averages 9 hours with Ambien. Focus and concentration difficulties  - "big time". Forcing herself to get things done. Completing tasks. Managing aspects of household. Unemployed. Denies SI or HI.  Denies AH or VH.  One kidney x 12 years ago - working with PCP to manage BP.    Review of Systems:  Review of Systems  Musculoskeletal:  Negative for gait problem.  Neurological:  Negative for tremors.  Psychiatric/Behavioral:         Please refer to HPI   Medications: I have reviewed the patient's current medications.  Current Outpatient Medications  Medication Sig Dispense Refill   albuterol (VENTOLIN HFA) 108 (90 Base) MCG/ACT inhaler Inhale 2 puffs into the lungs every 6 (six) hours as needed for wheezing or shortness of breath. 8 g 3   ALPRAZolam (XANAX) 0.25 MG tablet Take 1 tablet (0.25 mg total) by mouth 3 (three) times daily as needed for anxiety. 90 tablet 2   ARIPiprazole (ABILIFY) 5 MG tablet Take one and 1/2 tablets daily. 45 tablet 5   BIOTIN PO Take 2,500 mg by mouth daily. [facility].     citalopram (CELEXA) 40 MG tablet Take 1 tablet (40 mg total) by mouth daily. (prescribed by Psych) 90 tablet 1   Cyanocobalamin (VITAMIN B12 PO) Take  1 tablet by mouth as needed (when patient remembers to take).     Glucosamine-Chondroitin (MOVE FREE PO) Take 1 tablet by mouth as needed (when patient remembers to take).     losartan (COZAAR) 100 MG tablet Take 1 tablet (100 mg total) by mouth daily. 90 tablet 1   metoprolol succinate (TOPROL-XL) 25 MG 24 hr tablet TAKE 1 TABLET BY MOUTH EVERY DAY ALONG WITH THE 50MG  TO TOTAL 75MG  DAILY 90  tablet 1   metoprolol succinate (TOPROL-XL) 50 MG 24 hr tablet 1 TABLET ONCE DAILY ALONG WITH 25MG  TO TOTAL 75MG  DAILY. TAKE WITH OR IMMEDIATELY FOLLOWING A MEAL 90 tablet 1   OVER THE COUNTER MEDICATION Take 1 capsule by mouth daily. [facility]     OVER THE COUNTER MEDICATION Take 1,000 mg by mouth daily. Tumeric     testosterone cypionate (DEPOTESTOSTERONE CYPIONATE) 200 MG/ML injection Inject 0.5 mg into the muscle every 7 (seven) days. No bottle shown for this medication     VITAMIN D PO Take 1 tablet by mouth as needed (when patient remembers to take).     zolpidem (AMBIEN) 10 MG tablet TAKE 1 TABLET BY MOUTH EVERYDAY AT BEDTIME 30 tablet 2   No current facility-administered medications for this visit.    Medication Side Effects: None  Allergies:  Allergies  Allergen Reactions   Benzocaine-Menthol    Cholecalciferol    Other     Other reaction(s): Unknown   Skin Adhesives [Cyanoacrylate]    Chlorhexidine Rash and Hives   Codeine Other (See Comments)    Stomach problems patient reports   Iodinated Diagnostic Agents Rash   Penicillins Rash   Sulfa Antibiotics Rash    Past Medical History:  Diagnosis Date   Anxiety    Arthritis    Chronic kidney disease 2008   Nephrectomy    Depression    High blood pressure    High cholesterol    Insomnia     Family History  Problem Relation Age of Onset   Osteoporosis Mother    Arthritis Mother     Social History   Socioeconomic History   Marital status: Married    Spouse name: Not on file   Number of children: Not on file   Years of education: Not on file   Highest education level: Not on file  Occupational History   Not on file  Tobacco Use   Smoking status: Never   Smokeless tobacco: Never  Vaping Use   Vaping Use: Never used  Substance and Sexual Activity   Alcohol use: Yes    Alcohol/week: 1.0 standard drink    Types: 1 Glasses of wine per week    Comment: Daily   Drug use: Never   Sexual activity:  Not on file  Other Topics Concern   Not on file  Social History Narrative   Tobacco use:  None   Alcohol use: 7 per week   Diet:  Low carb.    Do you drink/eat things with caffeine?  A cup of coffee a day   Marital status:  Married   Do you live in a house, apartment, assisted living, condo, trailer, etc.)?  House   Is it one or more stories?  One story    How many persons live in your home?  Two (patient stated one, but is married)   Do you have any pets in your home? (please list) Dogs and birds   Current or past profession:  Retired  Highest level of education:  High school   Do you exercise:  Occasionally - twice a week.       Advanced Directive   No living will   No DNR   No POA/HPOA      Functional status:   No difficulty bathing or dressing   No difficulty preparing food or eating   No difficulty managing medications   No difficulty managing finances   No difficulty affording medications.      Social Determinants of Health   Financial Resource Strain: Not on file  Food Insecurity: Not on file  Transportation Needs: Not on file  Physical Activity: Not on file  Stress: Not on file  Social Connections: Not on file  Intimate Partner Violence: Not on file    Past Medical History, Surgical history, Social history, and Family history were reviewed and updated as appropriate.   Please see review of systems for further details on the patient's review from today.   Objective:   Physical Exam:  There were no vitals taken for this visit.  Physical Exam Neurological:     Mental Status: She is alert and oriented to person, place, and time.     Cranial Nerves: No dysarthria.  Psychiatric:        Attention and Perception: Attention and perception normal.        Mood and Affect: Mood normal.        Speech: Speech normal.        Behavior: Behavior is cooperative.        Thought Content: Thought content normal. Thought content is not paranoid or delusional. Thought  content does not include homicidal or suicidal ideation. Thought content does not include homicidal or suicidal plan.        Cognition and Memory: Cognition and memory normal.        Judgment: Judgment normal.     Comments: Insight intact    Lab Review:     Component Value Date/Time   NA 140 07/31/2020 0823   K 4.6 07/31/2020 0823   CL 106 07/31/2020 0823   CO2 24 07/31/2020 0823   GLUCOSE 85 07/31/2020 0823   BUN 13 07/31/2020 0823   CREATININE 0.95 07/31/2020 0823   CALCIUM 9.3 07/31/2020 0823   PROT 6.8 07/31/2020 0823   AST 15 07/31/2020 0823   ALT 14 07/31/2020 0823   BILITOT 0.7 07/31/2020 0823   GFRNONAA 70 07/31/2020 0823   GFRAA 82 07/31/2020 0823       Component Value Date/Time   WBC 9.2 08/02/2020 1344   RBC 5.27 (H) 08/02/2020 1344   HGB 15.9 (H) 08/02/2020 1344   HCT 47.3 (H) 08/02/2020 1344   PLT 388 08/02/2020 1344   MCV 89.8 08/02/2020 1344   MCH 30.2 08/02/2020 1344   MCHC 33.6 08/02/2020 1344   RDW 12.0 08/02/2020 1344   LYMPHSABS 3,110 08/02/2020 1344   EOSABS 570 (H) 08/02/2020 1344   BASOSABS 74 08/02/2020 1344    No results found for: POCLITH, LITHIUM   No results found for: PHENYTOIN, PHENOBARB, VALPROATE, CBMZ   .res Assessment: Plan:    Plan:  PDMP reviewed  1. Celexa 40mg  daily 2. Ambien 10mg  daily 3. Xanax 0.25mg  daily to three times daily - not using 4. D/C Abilify 5mg  daily - intolerability     [facility] ADHD test 37/58 ADHD possible  RTC 4 weeks  Patient advised to contact office with any questions, adverse effects, or acute worsening in signs and  symptoms.  Discussed potential benefits, risks, and side effects of stimulants with patient to include increased heart rate, palpitations, insomnia, increased anxiety, increased irritability, or decreased appetite.  Instructed patient to contact office if experiencing any significant tolerability issues.  Discussed potential benefits, risk, and side effects of  benzodiazepines to include potential risk of tolerance and dependence, as well as possible drowsiness.  Advised patient not to drive if experiencing drowsiness and to take lowest possible effective dose to minimize risk of dependence and tolerance  There are no diagnoses linked to this encounter.  Please see After Visit Summary for patient specific instructions.  Future Appointments  Date Time Provider Department Center  02/22/2021 11:20 AM Carmie Lanpher, Thereasa Solo, NP [facility] None  02/28/2021  1:40 PM Mussa Groesbeck, Thereasa Solo, NP [facility] None  03/22/2021 12:00 PM Mathis Fare, LCSW [facility] None  08/24/2021  9:00 AM Ngetich, Dinah C, NP [facility] None    No orders of the defined types were placed in this encounter.     -------------------------------

## 2021-02-22 NOTE — Telephone Encounter (Signed)
No lab results received yet.

## 2021-02-22 NOTE — Telephone Encounter (Signed)
electrolytes,Kidney,liver function and thyroid level are all within normal range. - Hemoglobin level is slightly high.Are you taking any supplements or multivitamin ?   - Total cholesterol and LDL ( bad cholesterol ) are high. Will refer to Cardiologist for hyperlipidemia management since unable to tolerate statins and Tricor

## 2021-02-22 NOTE — Telephone Encounter (Signed)
The labs have been scanned under Media dated 02/14/2021

## 2021-02-23 NOTE — Telephone Encounter (Signed)
Patient would like for you to Place that Referral to Cardiologist for her cholesterol  Patient stated that she is taking a OTC MVT and Probiotic.

## 2021-02-23 NOTE — Telephone Encounter (Signed)
Referral to cardiologist ordered for further evaluation of hyperlipidemia due to allergies to statin and Tricor.

## 2021-02-27 ENCOUNTER — Telehealth: Payer: Self-pay | Admitting: Adult Health

## 2021-02-27 NOTE — Telephone Encounter (Signed)
Noted  

## 2021-02-27 NOTE — Telephone Encounter (Signed)
FYI

## 2021-02-27 NOTE — Telephone Encounter (Signed)
Next visit is 03/22/21. Christina Leonard called to just say that all her side effects are gone now. No reason to call back. Just an FYI.

## 2021-02-28 ENCOUNTER — Telehealth: Payer: Self-pay | Admitting: Adult Health

## 2021-02-28 ENCOUNTER — Ambulatory Visit: Payer: PRIVATE HEALTH INSURANCE | Admitting: Adult Health

## 2021-02-28 DIAGNOSIS — M25561 Pain in right knee: Secondary | ICD-10-CM | POA: Diagnosis not present

## 2021-02-28 DIAGNOSIS — M5412 Radiculopathy, cervical region: Secondary | ICD-10-CM | POA: Diagnosis not present

## 2021-02-28 DIAGNOSIS — M25562 Pain in left knee: Secondary | ICD-10-CM | POA: Diagnosis not present

## 2021-02-28 DIAGNOSIS — R519 Headache, unspecified: Secondary | ICD-10-CM | POA: Diagnosis not present

## 2021-02-28 NOTE — Telephone Encounter (Signed)
Pt called and said that now that the side effects are gone she would like to try a different medicine. Please call her at [phone]

## 2021-02-28 NOTE — Telephone Encounter (Signed)
Please review

## 2021-03-01 NOTE — Telephone Encounter (Signed)
Lets try the Latuda at 20mg  daily - take at supper with 350 calories.

## 2021-03-01 NOTE — Telephone Encounter (Signed)
Let's try adding the Rexulti 0.5mg  - it is similar to the Abilify, but will hopefully be less activating.

## 2021-03-01 NOTE — Telephone Encounter (Signed)
Pt stated she tried this med before and it did not work well for her.She stated she spoke to you about trying latuda or another medication that starts with a C she can't remember the name but you spoke about it.

## 2021-03-02 ENCOUNTER — Other Ambulatory Visit: Payer: Self-pay

## 2021-03-02 MED ORDER — ATOMOXETINE HCL 25 MG PO CAPS: ORAL_CAPSULE | ORAL | 0 refills | Status: DC

## 2021-03-02 MED ORDER — ATOMOXETINE HCL 40 MG PO CAPS: 40.0000 mg | ORAL_CAPSULE | Freq: Every day | ORAL | 0 refills | Status: DC

## 2021-03-02 NOTE — Telephone Encounter (Signed)
Pt informed

## 2021-03-02 NOTE — Telephone Encounter (Signed)
I was in the process of sending latuda.Do you want her to try Strattera instead?

## 2021-03-02 NOTE — Telephone Encounter (Signed)
She called back and left a message stating that the medicine she was trying to think of was stratera. She would like to try that one. Please give her a call at [phone]

## 2021-03-02 NOTE — Telephone Encounter (Signed)
Rx sent and pt informed.She is asking for the latuda as well for mood.She stated she discussed this with you before about taking both.Ok to send still?

## 2021-03-02 NOTE — Telephone Encounter (Signed)
Ok to send Stratera 25mg  daily x 7 days, then increase to 40mg  daily. This will require two separate prescriptions.

## 2021-03-02 NOTE — Telephone Encounter (Signed)
Yes. But needs to start one at a time.

## 2021-03-05 DIAGNOSIS — R519 Headache, unspecified: Secondary | ICD-10-CM | POA: Diagnosis not present

## 2021-03-05 DIAGNOSIS — M25562 Pain in left knee: Secondary | ICD-10-CM | POA: Diagnosis not present

## 2021-03-05 DIAGNOSIS — M25561 Pain in right knee: Secondary | ICD-10-CM | POA: Diagnosis not present

## 2021-03-05 DIAGNOSIS — M5412 Radiculopathy, cervical region: Secondary | ICD-10-CM | POA: Diagnosis not present

## 2021-03-08 ENCOUNTER — Other Ambulatory Visit: Payer: Self-pay | Admitting: General Surgery

## 2021-03-08 DIAGNOSIS — R109 Unspecified abdominal pain: Secondary | ICD-10-CM | POA: Diagnosis not present

## 2021-03-09 ENCOUNTER — Other Ambulatory Visit: Payer: Self-pay | Admitting: Adult Health

## 2021-03-09 ENCOUNTER — Telehealth: Payer: Self-pay | Admitting: Adult Health

## 2021-03-09 DIAGNOSIS — F909 Attention-deficit hyperactivity disorder, unspecified type: Secondary | ICD-10-CM

## 2021-03-09 MED ORDER — LISDEXAMFETAMINE DIMESYLATE 10 MG PO CAPS: 10.0000 mg | ORAL_CAPSULE | Freq: Every day | ORAL | 0 refills | Status: DC

## 2021-03-09 NOTE — Telephone Encounter (Signed)
Pt stated she takes it at night and it makes her extremely tired the next day.She wants to know if she can try vyvanse again but at a low dose.She stated on the vyvanse she would "crash" and wants to know if something can be added to help with that.

## 2021-03-09 NOTE — Telephone Encounter (Signed)
Pt called and said that the Christina Leonard is making her have extreme fatigue. She can't get out of bed or do anything. She would like to go back on the vyvanse again. Please call her at [phone]

## 2021-03-09 NOTE — Telephone Encounter (Signed)
Can you send rx?

## 2021-03-09 NOTE — Telephone Encounter (Signed)
Script sent  

## 2021-03-09 NOTE — Telephone Encounter (Signed)
Pt informed

## 2021-03-09 NOTE — Telephone Encounter (Signed)
We can try the 10mg  dose.

## 2021-03-14 DIAGNOSIS — M25562 Pain in left knee: Secondary | ICD-10-CM | POA: Diagnosis not present

## 2021-03-14 DIAGNOSIS — R519 Headache, unspecified: Secondary | ICD-10-CM | POA: Diagnosis not present

## 2021-03-14 DIAGNOSIS — M5412 Radiculopathy, cervical region: Secondary | ICD-10-CM | POA: Diagnosis not present

## 2021-03-14 DIAGNOSIS — M25561 Pain in right knee: Secondary | ICD-10-CM | POA: Diagnosis not present

## 2021-03-15 ENCOUNTER — Other Ambulatory Visit: Payer: Self-pay | Admitting: General Surgery

## 2021-03-15 DIAGNOSIS — R109 Unspecified abdominal pain: Secondary | ICD-10-CM

## 2021-03-19 ENCOUNTER — Telehealth (INDEPENDENT_AMBULATORY_CARE_PROVIDER_SITE_OTHER): Payer: BC Managed Care – PPO | Admitting: Adult Health

## 2021-03-19 DIAGNOSIS — F909 Attention-deficit hyperactivity disorder, unspecified type: Secondary | ICD-10-CM | POA: Diagnosis not present

## 2021-03-19 DIAGNOSIS — F410 Panic disorder [episodic paroxysmal anxiety] without agoraphobia: Secondary | ICD-10-CM | POA: Diagnosis not present

## 2021-03-19 DIAGNOSIS — F411 Generalized anxiety disorder: Secondary | ICD-10-CM

## 2021-03-19 DIAGNOSIS — F331 Major depressive disorder, recurrent, moderate: Secondary | ICD-10-CM

## 2021-03-19 MED ORDER — LISDEXAMFETAMINE DIMESYLATE 20 MG PO CAPS: 20.0000 mg | ORAL_CAPSULE | Freq: Every day | ORAL | 0 refills | Status: DC

## 2021-03-19 MED ORDER — LATUDA 20 MG PO TABS: 20.0000 mg | ORAL_TABLET | Freq: Every evening | ORAL | 2 refills | Status: DC

## 2021-03-19 NOTE — Progress Notes (Signed)
Christina Leonard [id number] 01-Sep-1971 50 y.o.  Virtual Visit via Telephone Note  I connected with pt on 03/19/21 at  9:00 AM EDT by telephone and verified that I am speaking with the correct person using two identifiers.   I discussed the limitations, risks, security and privacy concerns of performing an evaluation and management service by telephone and the availability of in person appointments. I also discussed with the patient that there may be a patient responsible charge related to this service. The patient expressed understanding and agreed to proceed.   I discussed the assessment and treatment plan with the patient. The patient was provided an opportunity to ask questions and all were answered. The patient agreed with the plan and demonstrated an understanding of the instructions.   The patient was advised to call back or seek an in-person evaluation if the symptoms worsen or if the condition fails to improve as anticipated.  I provided 10 minutes of non-face-to-face time during this encounter.  The patient was located at home.  The provider was located at [facility].   Dorothyann Gibbs, NP   Subjective:   Patient ID:  Christina Leonard is a 50 y.o. (DOB 07-21-1971) female.  Chief Complaint: No chief complaint on file.   HPI Christina Leonard presents for follow-up of MDD, GAD, insomnia, and panic attacks.  Describes mood today as "very dull". Pleasant. Mood symptoms - denies anxiety or panic attacks. Denies irritability. Feels depressed - "very much so". Has tried the Abilify and it made her restless. Feels like the Risperdal was helpful, but caused weight gain. Willing to try a different medication. Recently restarted the Vyvanse at 10mg  daily and does not feel it lasts throughout the day. Stating "it's been a little bit helpful". She and husband doing well. Varying interest and motivation. Taking medications as prescribed.  Energy levels "kinda down". Active, does not have a  regular exercise routine. Enjoys some usual interests and activities. Married. Lives with husband x 25 years - 3 dog and 2 cats. Two daughters 93 and 20. Daughters are in 25. Moved to [place] from [place] for husband's job. Spending time with family. Appetite adequate. Weight gain - 148 pounds. Sleeps well most nights. Averages 9 hours with Ambien. Focus and concentration difficulties - "somewhat". Completing tasks. Managing aspects of household. Unemployed. Denies SI or HI.  Denies AH or VH.  One kidney x 12 years ago - working with PCP to manage BP.    Review of Systems:  Review of Systems  Musculoskeletal:  Negative for gait problem.  Neurological:  Negative for tremors.  Psychiatric/Behavioral:         Please refer to HPI   Medications: I have reviewed the patient's current medications.  Current Outpatient Medications  Medication Sig Dispense Refill   lurasidone (LATUDA) 20 MG TABS tablet Take 1 tablet (20 mg total) by mouth at bedtime. 30 tablet 2   albuterol (VENTOLIN HFA) 108 (90 Base) MCG/ACT inhaler Inhale 2 puffs into the lungs every 6 (six) hours as needed for wheezing or shortness of breath. 8 g 3   ALPRAZolam (XANAX) 0.25 MG tablet Take 1 tablet (0.25 mg total) by mouth 3 (three) times daily as needed for anxiety. 90 tablet 2   BIOTIN PO Take 2,500 mg by mouth daily. [facility].     citalopram (CELEXA) 40 MG tablet Take 1 tablet (40 mg total) by mouth daily. (prescribed by Psych) 90 tablet 1   Cyanocobalamin (VITAMIN B12 PO) Take  1 tablet by mouth as needed (when patient remembers to take).     Glucosamine-Chondroitin (MOVE FREE PO) Take 1 tablet by mouth as needed (when patient remembers to take).     lisdexamfetamine (VYVANSE) 20 MG capsule Take 1 capsule (20 mg total) by mouth daily. 30 capsule 0   losartan (COZAAR) 100 MG tablet Take 1 tablet (100 mg total) by mouth daily. 90 tablet 1   metoprolol succinate (TOPROL-XL) 25 MG 24 hr tablet TAKE 1 TABLET  BY MOUTH EVERY DAY ALONG WITH THE 50MG  TO TOTAL 75MG  DAILY 90 tablet 1   metoprolol succinate (TOPROL-XL) 50 MG 24 hr tablet 1 TABLET ONCE DAILY ALONG WITH 25MG  TO TOTAL 75MG  DAILY. TAKE WITH OR IMMEDIATELY FOLLOWING A MEAL 90 tablet 1   OVER THE COUNTER MEDICATION Take 1 capsule by mouth daily. [facility]     OVER THE COUNTER MEDICATION Take 1,000 mg by mouth daily. Tumeric     testosterone cypionate (DEPOTESTOSTERONE CYPIONATE) 200 MG/ML injection Inject 0.5 mg into the muscle every 7 (seven) days. No bottle shown for this medication     VITAMIN D PO Take 1 tablet by mouth as needed (when patient remembers to take).     zolpidem (AMBIEN) 10 MG tablet TAKE 1 TABLET BY MOUTH EVERYDAY AT BEDTIME 30 tablet 2   No current facility-administered medications for this visit.    Medication Side Effects: None  Allergies:  Allergies  Allergen Reactions   Benzocaine-Menthol    Cholecalciferol    Other     Other reaction(s): Unknown   Skin Adhesives [Cyanoacrylate]    Chlorhexidine Rash and Hives   Codeine Other (See Comments)    Stomach problems patient reports   Iodinated Diagnostic Agents Rash   Penicillins Rash   Sulfa Antibiotics Rash    Past Medical History:  Diagnosis Date   Anxiety    Arthritis    Chronic kidney disease 2008   Nephrectomy    Depression    High blood pressure    High cholesterol    Insomnia     Family History  Problem Relation Age of Onset   Osteoporosis Mother    Arthritis Mother     Social History   Socioeconomic History   Marital status: Married    Spouse name: Not on file   Number of children: Not on file   Years of education: Not on file   Highest education level: Not on file  Occupational History   Not on file  Tobacco Use   Smoking status: Never   Smokeless tobacco: Never  Vaping Use   Vaping Use: Never used  Substance and Sexual Activity   Alcohol use: Yes    Alcohol/week: 1.0 standard drink    Types: 1 Glasses of wine per  week    Comment: Daily   Drug use: Never   Sexual activity: Not on file  Other Topics Concern   Not on file  Social History Narrative   Tobacco use:  None   Alcohol use: 7 per week   Diet:  Low carb.    Do you drink/eat things with caffeine?  A cup of coffee a day   Marital status:  Married   Do you live in a house, apartment, assisted living, condo, trailer, etc.)?  House   Is it one or more stories?  One story    How many persons live in your home?  Two (patient stated one, but is married)   Do you have  any pets in your home? (please list) Dogs and birds   Current or past profession:  Retired   Highest level of education:  High school   Do you exercise:  Occasionally - twice a week.       Advanced Directive   No living will   No DNR   No POA/HPOA      Functional status:   No difficulty bathing or dressing   No difficulty preparing food or eating   No difficulty managing medications   No difficulty managing finances   No difficulty affording medications.      Social Determinants of Health   Financial Resource Strain: Not on file  Food Insecurity: Not on file  Transportation Needs: Not on file  Physical Activity: Not on file  Stress: Not on file  Social Connections: Not on file  Intimate Partner Violence: Not on file    Past Medical History, Surgical history, Social history, and Family history were reviewed and updated as appropriate.   Please see review of systems for further details on the patient's review from today.   Objective:   Physical Exam:  There were no vitals taken for this visit.  Physical Exam Neurological:     Mental Status: She is alert and oriented to person, place, and time.     Cranial Nerves: No dysarthria.  Psychiatric:        Attention and Perception: Attention and perception normal.        Mood and Affect: Mood normal.        Speech: Speech normal.        Behavior: Behavior is cooperative.        Thought Content: Thought content  normal. Thought content is not paranoid or delusional. Thought content does not include homicidal or suicidal ideation. Thought content does not include homicidal or suicidal plan.        Cognition and Memory: Cognition and memory normal.        Judgment: Judgment normal.     Comments: Insight intact    Lab Review:     Component Value Date/Time   NA 140 07/31/2020 0823   K 4.6 07/31/2020 0823   CL 106 07/31/2020 0823   CO2 24 07/31/2020 0823   GLUCOSE 85 07/31/2020 0823   BUN 13 07/31/2020 0823   CREATININE 0.95 07/31/2020 0823   CALCIUM 9.3 07/31/2020 0823   PROT 6.8 07/31/2020 0823   AST 15 07/31/2020 0823   ALT 14 07/31/2020 0823   BILITOT 0.7 07/31/2020 0823   GFRNONAA 70 07/31/2020 0823   GFRAA 82 07/31/2020 0823       Component Value Date/Time   WBC 9.2 08/02/2020 1344   RBC 5.27 (H) 08/02/2020 1344   HGB 15.9 (H) 08/02/2020 1344   HCT 47.3 (H) 08/02/2020 1344   PLT 388 08/02/2020 1344   MCV 89.8 08/02/2020 1344   MCH 30.2 08/02/2020 1344   MCHC 33.6 08/02/2020 1344   RDW 12.0 08/02/2020 1344   LYMPHSABS 3,110 08/02/2020 1344   EOSABS 570 (H) 08/02/2020 1344   BASOSABS 74 08/02/2020 1344    No results found for: POCLITH, LITHIUM   No results found for: PHENYTOIN, PHENOBARB, VALPROATE, CBMZ   .res Assessment: Plan:    Plan:  PDMP reviewed  1. Celexa 40mg  daily 2. Ambien 10mg  daily 3. Xanax 0.25mg  daily to three times daily - not using 4. Increase Vyvanse 10mg  to 20mg  daily 5. Add Latuda 20mg  at dinner  [facility] ADHD test 37/58 ADHD possible  RTC 4 weeks  Patient advised to contact office with any questions, adverse effects, or acute worsening in signs and symptoms.  Discussed potential benefits, risks, and side effects of stimulants with patient to include increased heart rate, palpitations, insomnia, increased anxiety, increased irritability, or decreased appetite.  Instructed patient to contact office if experiencing any significant  tolerability issues.  Discussed potential benefits, risk, and side effects of benzodiazepines to include potential risk of tolerance and dependence, as well as possible drowsiness.  Advised patient not to drive if experiencing drowsiness and to take lowest possible effective dose to minimize risk of dependence and tolerance  Diagnoses and all orders for this visit:  Major depressive disorder, recurrent episode, moderate (HCC) -     lurasidone (LATUDA) 20 MG TABS tablet; Take 1 tablet (20 mg total) by mouth at bedtime.  Attention deficit hyperactivity disorder (ADHD), unspecified ADHD type -     lisdexamfetamine (VYVANSE) 20 MG capsule; Take 1 capsule (20 mg total) by mouth daily.  Panic attacks  Generalized anxiety disorder   Please see After Visit Summary for patient specific instructions.  Future Appointments  Date Time Provider Department Center  03/22/2021 12:00 PM Mathis Fare, LCSW [facility] None  04/03/2021  3:00 PM [facility] 1 [facility] [facility]  07/13/2021  1:45 PM Hilty, Lisette Abu, MD [facility]  08/24/2021  9:00 AM Ngetich, Donalee Citrin, NP [facility] None    No orders of the defined types were placed in this encounter.     -------------------------------

## 2021-03-20 DIAGNOSIS — N941 Unspecified dyspareunia: Secondary | ICD-10-CM | POA: Diagnosis not present

## 2021-03-20 DIAGNOSIS — R635 Abnormal weight gain: Secondary | ICD-10-CM | POA: Diagnosis not present

## 2021-03-21 ENCOUNTER — Other Ambulatory Visit: Payer: Self-pay | Admitting: Family

## 2021-03-22 ENCOUNTER — Other Ambulatory Visit: Payer: Self-pay

## 2021-03-22 ENCOUNTER — Ambulatory Visit (INDEPENDENT_AMBULATORY_CARE_PROVIDER_SITE_OTHER): Payer: PRIVATE HEALTH INSURANCE | Admitting: Psychiatry

## 2021-03-22 DIAGNOSIS — F331 Major depressive disorder, recurrent, moderate: Secondary | ICD-10-CM

## 2021-03-22 NOTE — Progress Notes (Signed)
[facility] Counsel[facility] Initial Adult Exam  Name: Christina Leonard Date: 03/22/2021 MRN: [id number] DOB: August 25, 1971 PCP: Caesar Bookman, NP  Time spent: 60 minutes  Guardian/Payee:  n/a    Paperwork requested:  No   Reason for Visit /Presenting Problem: depression, anxiety  Mental Status Exam:    Appearance:   Casual and Neat     Behavior:  Appropriate, Sharing, and Motivated  Motor:  Normal  Speech/Language:   Clear and Coherent and Normal Rate  Affect:  Depression, anxiety  Mood:  anxious and depressed  Thought process:  normal  Thought content:    Some obsessiveness  Sensory/Perceptual disturbances:    WNL  Orientation:  oriented to person, place, time/date, situation, day of week, month of year, year, and stated date of March 22, 2021  Attention:  Fair  Concentration:  Fair  Memory:  I do tend to forget sometimes  Fund of knowledge:   Good  Insight:    Good  Judgment:   Good  Impulse Control:  Good   Reported Symptoms:  see symptoms noted above  Risk Assessment: Danger to Self:  No Self-injurious Behavior: No Danger to Others: No Duty to Warn:no Physical Aggression / Violence:No  Access to Firearms a concern: No  Gang Involvement:No  Patient / guardian was educated about steps to take if suicide or homicide risk level increases between visits: Patient understands how to access emergency care if ever needed.  While future psychiatric events cannot be accurately predicted, the patient does not currently require acute inpatient psychiatric care and does not currently meet [facility] involuntary commitment criteria.  Substance Abuse History: Current substance abuse: No     Past Psychiatric History:   Previous psychological history is significant for anxiety and depression Outpatient Providers: in [place] History of Psych Hospitalization: No  Psychological Testing:  none    Abuse History: Victim of No.,  none    Report needed: No. Victim of  Neglect:No. Perpetrator of  no   Witness / Exposure to Domestic Violence: No   Protective Services Involvement: No  Witness to [employer] Violence:  No   Family History: Reviewed with patient and info below confirmed. Family History  Problem Relation Age of Onset   Osteoporosis Mother    Arthritis Mother     Living situation: the patient lives with their spouse  Sexual Orientation:  Straight  Relationship Status: married 20 yrs Name of spouse / other:             If a parent, number of children / ages: 2 daughters ages 12 and 69 ; "I dont' like talking to others about my problems".  I'm private and don't like to open up to people. It took a lot for me to come here.  Support Systems; mom and patient's husband are supportive.  Don't talk to daughters about her issues.   Financial Stress:  Yes   Income/Employment/Disability: husband is employed, Patient states she is originally from [place].  Military Service: No   Educational History: Education: high school diploma/GED  Religion/Sprituality/World View:   Muslim  Any cultural differences that may affect / interfere with treatment:  not applicable   Recreation/Hobbies: spend time with my 3 pets (dogs, birt)  Stressors:Financial difficulties Health problems     "I have bad spinal stenosis, bad knees, high blood pressure, has 1 kidney."  Strengths:  Family, Friends, and Self Advocate  Barriers:  "myself, it's hard for me to open up"  Legal History: Pending legal issue /  charges:  n/a. History of legal issue / charges:  n/a  Medical History/Surgical History:Reviewed with patient and she confirms info below. Past Medical History:  Diagnosis Date   Anxiety    Arthritis    Chronic kidney disease 2008   Nephrectomy    Depression    High blood pressure    High cholesterol    Insomnia     Past Surgical History:  Procedure Laterality Date   NEPHRECTOMY  2008   Done in [place]   VAGINAL HYSTERECTOMY  2010   Dr.  Ernie Hew    Medications: Current Outpatient Medications  Medication Sig Dispense Refill   albuterol (VENTOLIN HFA) 108 (90 Base) MCG/ACT inhaler Inhale 2 puffs into the lungs every 6 (six) hours as needed for wheezing or shortness of breath. 8 g 3   ALPRAZolam (XANAX) 0.25 MG tablet Take 1 tablet (0.25 mg total) by mouth 3 (three) times daily as needed for anxiety. 90 tablet 2   BIOTIN PO Take 2,500 mg by mouth daily. [facility].     citalopram (CELEXA) 40 MG tablet Take 1 tablet (40 mg total) by mouth daily. (prescribed by Psych) 90 tablet 1   Cyanocobalamin (VITAMIN B12 PO) Take 1 tablet by mouth as needed (when patient remembers to take).     Glucosamine-Chondroitin (MOVE FREE PO) Take 1 tablet by mouth as needed (when patient remembers to take).     lisdexamfetamine (VYVANSE) 20 MG capsule Take 1 capsule (20 mg total) by mouth daily. 30 capsule 0   losartan (COZAAR) 100 MG tablet Take 1 tablet (100 mg total) by mouth daily. 90 tablet 1   lurasidone (LATUDA) 20 MG TABS tablet Take 1 tablet (20 mg total) by mouth at bedtime. 30 tablet 2   metoprolol succinate (TOPROL-XL) 25 MG 24 hr tablet TAKE 1 TABLET BY MOUTH EVERY DAY ALONG WITH THE 50MG  TO TOTAL 75MG  DAILY 90 tablet 1   metoprolol succinate (TOPROL-XL) 50 MG 24 hr tablet 1 TABLET ONCE DAILY ALONG WITH 25MG  TO TOTAL 75MG  DAILY. TAKE WITH OR IMMEDIATELY FOLLOWING A MEAL 90 tablet 1   OVER THE COUNTER MEDICATION Take 1 capsule by mouth daily. [facility]     OVER THE COUNTER MEDICATION Take 1,000 mg by mouth daily. Tumeric     testosterone cypionate (DEPOTESTOSTERONE CYPIONATE) 200 MG/ML injection Inject 0.5 mg into the muscle every 7 (seven) days. No bottle shown for this medication     VITAMIN D PO Take 1 tablet by mouth as needed (when patient remembers to take).     zolpidem (AMBIEN) 10 MG tablet TAKE 1 TABLET BY MOUTH EVERYDAY AT BEDTIME 30 tablet 2   No current facility-administered medications for this  visit.    Allergies  Allergen Reactions   Benzocaine-Menthol    Cholecalciferol    Other     Other reaction(s): Unknown   Skin Adhesives [Cyanoacrylate]    Chlorhexidine Rash and Hives   Codeine Other (See Comments)    Stomach problems patient reports   Iodinated Diagnostic Agents Rash   Penicillins Rash   Sulfa Antibiotics Rash    Diagnoses:    ICD-10-CM   1. Major depressive disorder, recurrent episode, moderate (HCC)  F33.1       Treatment Goal Plan: Patient not signing treatment plan on computer screen due to Covid. Treatment Goals: Treatment goals remain on tx plan as patient works with strategies to achieve her goals. Progress is assessed each session and documented in  "  Progress" section of Plan. Long Term Goal: Decrease depression from an 8/9 to at least a 2/3 on a 1-10 scale with 10 being the highest. Develop healthy cognitive patterns and beliefs about self and the world that lead to alleviation and help prevent relapse of depressive symptoms. Short Term Goal: Verbally express understanding of the relationship between depressed mood and repression of feelings including anger hurt and sadness. Strategy: Encouraged sharing feelings of depression in order to clarify them and gain insight as to causes.    Plan of Care: This is initial evaluation for therapy appt with this patient and we completed her evaluation and treatment goal plan.  Patient is a 50 year old married, female who reports her husband is supportive.,  And also has 2 daughters ages 28 and 69 who live outside of the home.  She presents with good insight, good judgment, and a sincere desire for help although admits she "has a very hard time talking to people about her problems".  Affect and mood are anxious and depressed.  Shares that her main symptoms are anxiety and depression with depression being the stronger symptom.  Reports getting prior help for about 3 months when she used to live in [place] several  years ago.  Denies any SI or HI.  Lives locally with her spouse who is employed.  Patient is not employed.  She reports talking to very few people about anything of a personal nature including her daughters.  Does state that she talks with her husband and that he is supportive.  Does not give out a lot of detailed information today but states she feels Next time she will be able to be a little more open.  The more we talked, I actually found her to become more open but did not push her on it.  Does have significant physical issues including "bad spinal stenosis, bad knees, high blood pressure, has 1 kidney.  No pending legal issues.  Feels close to her husband, has some friends but does not talk to them about personal issues.  Does seem to be a good self advocate.  Dressed casual and neat today and although has difficulty being very open, she appears motivated for treatment.  Worked collaboratively on her treatment goal plan, as noted above, and reviewed goals before she left today.  She is rescheduling for a return appointment and will follow-up at that time.  Review of treatment goal plan and patient agrees with goals.  Next appt within 2 weeks.   Mathis Fare, LCSW

## 2021-03-26 ENCOUNTER — Telehealth: Payer: Self-pay | Admitting: Adult Health

## 2021-03-26 NOTE — Telephone Encounter (Signed)
Christina Leonard called to report that the Latuda caused her very emotional swings. Was very angry and upset and irritable.  Could not tolerate this medication so she stopped taking it.  Please call to discuss options

## 2021-03-26 NOTE — Telephone Encounter (Signed)
Please review

## 2021-03-26 NOTE — Telephone Encounter (Signed)
Pt stated she is still irritable and depressed.She will call the front office to make an appt

## 2021-03-26 NOTE — Telephone Encounter (Signed)
Pt stated she is sleeping and she thinks she is sleeping too much.She takes vyvanse in the morning and does not take risperdal.

## 2021-03-26 NOTE — Telephone Encounter (Signed)
Call and see how she is doing now. We can discuss adding another medication at next appointment.

## 2021-03-26 NOTE — Telephone Encounter (Signed)
I was thinking about the Risperdal for her mood symptoms.

## 2021-03-26 NOTE — Telephone Encounter (Signed)
Is she sleeping? Has she been on Rispredal?

## 2021-03-28 DIAGNOSIS — M25512 Pain in left shoulder: Secondary | ICD-10-CM | POA: Diagnosis not present

## 2021-03-28 DIAGNOSIS — M545 Low back pain, unspecified: Secondary | ICD-10-CM | POA: Diagnosis not present

## 2021-03-30 ENCOUNTER — Encounter (HOSPITAL_COMMUNITY): Payer: Self-pay | Admitting: Emergency Medicine

## 2021-03-30 ENCOUNTER — Emergency Department (HOSPITAL_COMMUNITY)
Admission: EM | Admit: 2021-03-30 | Discharge: 2021-03-30 | Disposition: A | Discharge status: Home / Self Care | Payer: BC Managed Care – PPO | Attending: Student | Admitting: Student

## 2021-03-30 DIAGNOSIS — Z5321 Procedure and treatment not carried out due to patient leaving prior to being seen by health care provider: Secondary | ICD-10-CM | POA: Diagnosis not present

## 2021-03-30 DIAGNOSIS — R531 Weakness: Secondary | ICD-10-CM | POA: Insufficient documentation

## 2021-03-30 DIAGNOSIS — R109 Unspecified abdominal pain: Secondary | ICD-10-CM | POA: Diagnosis not present

## 2021-03-30 DIAGNOSIS — R1084 Generalized abdominal pain: Secondary | ICD-10-CM | POA: Diagnosis not present

## 2021-03-30 DIAGNOSIS — R110 Nausea: Secondary | ICD-10-CM | POA: Insufficient documentation

## 2021-03-30 DIAGNOSIS — R1012 Left upper quadrant pain: Secondary | ICD-10-CM | POA: Diagnosis not present

## 2021-03-30 DIAGNOSIS — R519 Headache, unspecified: Secondary | ICD-10-CM | POA: Insufficient documentation

## 2021-03-30 LAB — URINALYSIS, ROUTINE W REFLEX MICROSCOPIC
Bilirubin Urine: NEGATIVE
Glucose, UA: NEGATIVE mg/dL
Hgb urine dipstick: NEGATIVE
Ketones, ur: 5 mg/dL — AB
Nitrite: NEGATIVE
Protein, ur: NEGATIVE mg/dL
Specific Gravity, Urine: 1.025 (ref 1.005–1.030)
pH: 5 (ref 5.0–8.0)

## 2021-03-30 LAB — COMPREHENSIVE METABOLIC PANEL
ALT: 15 U/L (ref 0–44)
AST: 21 U/L (ref 15–41)
Albumin: 4 g/dL (ref 3.5–5.0)
Alkaline Phosphatase: 29 U/L — ABNORMAL LOW (ref 38–126)
Anion gap: 9 (ref 5–15)
BUN: 13 mg/dL (ref 6–20)
CO2: 25 mmol/L (ref 22–32)
Calcium: 9.1 mg/dL (ref 8.9–10.3)
Chloride: 104 mmol/L (ref 98–111)
Creatinine, Ser: 1.03 mg/dL — ABNORMAL HIGH (ref 0.44–1.00)
GFR, Estimated: >60 mL/min (ref >60)
Glucose, Bld: 101 mg/dL — ABNORMAL HIGH (ref 70–99)
Potassium: 3.9 mmol/L (ref 3.5–5.1)
Sodium: 138 mmol/L (ref 135–145)
Total Bilirubin: 1.2 mg/dL (ref 0.3–1.2)
Total Protein: 7.4 g/dL (ref 6.5–8.1)

## 2021-03-30 LAB — CBC WITH DIFFERENTIAL/PLATELET
Abs Immature Granulocytes: 0.03 10*3/uL (ref 0.00–0.07)
Basophils Absolute: 0 10*3/uL (ref 0.0–0.1)
Basophils Relative: 0 %
Eosinophils Absolute: 0 10*3/uL (ref 0.0–0.5)
Eosinophils Relative: 0 %
HCT: 48.1 % — ABNORMAL HIGH (ref 36.0–46.0)
Hemoglobin: 16.4 g/dL — ABNORMAL HIGH (ref 12.0–15.0)
Immature Granulocytes: 0 %
Lymphocytes Relative: 29 %
Lymphs Abs: 3.3 10*3/uL (ref 0.7–4.0)
MCH: 30.9 pg (ref 26.0–34.0)
MCHC: 34.1 g/dL (ref 30.0–36.0)
MCV: 90.8 fL (ref 80.0–100.0)
Monocytes Absolute: 0.7 10*3/uL (ref 0.1–1.0)
Monocytes Relative: 7 %
Neutro Abs: 7.3 10*3/uL (ref 1.7–7.7)
Neutrophils Relative %: 64 %
Platelets: 357 10*3/uL (ref 150–400)
RBC: 5.3 MIL/uL — ABNORMAL HIGH (ref 3.87–5.11)
RDW: 13.2 % (ref 11.5–15.5)
WBC: 11.4 10*3/uL — ABNORMAL HIGH (ref 4.0–10.5)
nRBC: 0 % (ref 0.0–0.2)

## 2021-03-30 LAB — LIPASE, BLOOD: Lipase: 54 U/L — ABNORMAL HIGH (ref 11–51)

## 2021-03-30 LAB — PREGNANCY, URINE: Preg Test, Ur: NEGATIVE

## 2021-03-30 MED ORDER — ONDANSETRON 4 MG PO TBDP
8.0000 mg | ORAL_TABLET | Freq: Once | ORAL | Status: AC
  Administered 2021-03-30: 8 mg via ORAL
  Filled 2021-03-30: qty 2

## 2021-03-30 NOTE — ED Triage Notes (Signed)
Pt endorses left sided abd pain for 2 weeks. Endorses weakness for 2 days and HA. Scheduled for CT on the 26th. Abd is distended.

## 2021-03-30 NOTE — ED Provider Notes (Signed)
Emergency Medicine Provider Triage Evaluation Note  Christina Leonard , a 50 y.o. female  was evaluated in triage.  Pt complains of quadrant abdominal pain.  She is having nausea, no vomiting, no dysuria, hematuria.  She had previous liposuction and hernia repair with mesh. She is passing stool and gas.  To make this week for contrast.  CT scan done next week but states she cannot wait.2  Review of Systems  Positive: Left upper quadrant abdominal pain, nausea Negative: Vomiting, dysuria, hematuria  Physical Exam  BP (!) 142/95   Pulse 70   Temp 98.6 F (37 C) (Oral)   Resp 16   Ht 5\' 3"  (1.6 m)   Wt 68 kg   SpO2 100%   BMI 26.57 kg/m  Gen:   Awake, no distress   Resp:  Normal effort  MSK:   Moves extremities without difficulty  Other:  LLQ tenderness, no CVA tenderness  Medical Decision Making  Medically screening exam initiated at 4:00 PM.  Appropriate orders placed.  Christina Leonard was informed that the remainder of the evaluation will be completed by another provider, this initial triage assessment does not replace that evaluation, and the importance of remaining in the ED until their evaluation is complete.     Minus Liberty, PA-C 03/30/21 04/01/21    [id number], MD 04/09/21 1346

## 2021-03-30 NOTE — ED Notes (Signed)
Pt left the ED. Notified this tech she was leaving/.

## 2021-04-02 ENCOUNTER — Encounter: Payer: Self-pay | Admitting: Family

## 2021-04-02 ENCOUNTER — Ambulatory Visit (INDEPENDENT_AMBULATORY_CARE_PROVIDER_SITE_OTHER): Payer: BC Managed Care – PPO | Admitting: Family

## 2021-04-02 ENCOUNTER — Other Ambulatory Visit: Payer: Self-pay

## 2021-04-02 VITALS — BP 130/90 | HR 78 | Temp 97.5°F | Resp 16 | Ht 63.0 in | Wt 143.6 lb

## 2021-04-02 DIAGNOSIS — F321 Major depressive disorder, single episode, moderate: Secondary | ICD-10-CM

## 2021-04-02 DIAGNOSIS — R399 Unspecified symptoms and signs involving the genitourinary system: Secondary | ICD-10-CM | POA: Diagnosis not present

## 2021-04-02 DIAGNOSIS — R110 Nausea: Secondary | ICD-10-CM | POA: Diagnosis not present

## 2021-04-02 DIAGNOSIS — D72829 Elevated white blood cell count, unspecified: Secondary | ICD-10-CM | POA: Diagnosis not present

## 2021-04-02 DIAGNOSIS — R195 Other fecal abnormalities: Secondary | ICD-10-CM | POA: Diagnosis not present

## 2021-04-02 DIAGNOSIS — F411 Generalized anxiety disorder: Secondary | ICD-10-CM

## 2021-04-02 LAB — POCT URINALYSIS DIPSTICK
Bilirubin, UA: NEGATIVE
Glucose, UA: NEGATIVE
Ketones, UA: NEGATIVE
Nitrite, UA: NEGATIVE
Protein, UA: NEGATIVE
Spec Grav, UA: <=1.005 — AB (ref 1.010–1.025)
Urobilinogen, UA: NEGATIVE E.U./dL — AB
pH, UA: 6.5 (ref 5.0–8.0)

## 2021-04-02 MED ORDER — ONDANSETRON HCL 4 MG PO TABS: 4.0000 mg | ORAL_TABLET | Freq: Three times a day (TID) | ORAL | 0 refills | Status: DC | PRN

## 2021-04-02 NOTE — Patient Instructions (Signed)
-   please schedule appointment with your counselor/Psychiatry to evaluate depression   - Take Xanax as prescribed for anxiety  - Recommend taking Zofran for nausea prior to meals or taking your medication to help with nausea.   - Notify provider of go to ED if symptoms worsen  - Urine specimen send for culture will call you in 2-3 days with final urine results

## 2021-04-02 NOTE — Progress Notes (Signed)
Provider: Marlowe Sax FNP-C  Charis Juliana, Nelda Bucks, NP  Patient Care Team: Estuardo Frisbee, Nelda Bucks, NP as PCP - General (Family Medicine)  Extended Emergency Contact Information Primary Emergency Contact: aubria, vanecek Mobile Phone: [phone] Relation: Spouse  Code Status:  Full Code  Goals of care: Advanced Directive information Advanced Directives 04/02/2021  Does Patient Have a Medical Advance Directive? No  Would patient like information on creating a medical advance directive? No - Patient declined     Chief Complaint  Patient presents with   Acute Visit    Patient complains of Anxiety, as well as left sided abdominal pain.     HPI:  Pt is a 50 y.o. female seen today for an acute visit for evaluation of anxiety and left side abdominal pain.states scheduled for CT scan tomorrow 04/03/21 then to follow up with [facility].  Had black stool yesterday. Has had nausea,weakness and unable to eat.Had a little bit of coffee and small muffin. Nausea described as sour in the throat.  She was seen in ED on 03/30/2021 for left side abdominal pain. Labs WBC 11.4 without any left shift ,Hgb 16.4  Lipase was high 54 with normal electrolytes and liver function.Her urine specimen showed amber, cloudy colored urine,positive for ketone,large leukocytes  but negative for nitrites. No culture ordered  She left ED without being seen by provider states waited for 6 hours so she left due to increased anxiety.  States has had increased stress level currently. Not taking her antidepressant as directed.states every medication gives her sides effects.    Past Medical History:  Diagnosis Date   Anxiety    Arthritis    Chronic kidney disease 2008   Nephrectomy    Depression    High blood pressure    High cholesterol    Insomnia    Past Surgical History:  Procedure Laterality Date   NEPHRECTOMY  2008   Done in [place]  2010   Dr. Lucia Bitter    Allergies   Allergen Reactions   Benzocaine-Menthol    Cholecalciferol    Other     Other reaction(s): Unknown   Skin Adhesives [Cyanoacrylate]    Chlorhexidine Rash and Hives   Codeine Other (See Comments)    Stomach problems patient reports   Iodinated Diagnostic Agents Rash   Penicillins Rash   Sulfa Antibiotics Rash and Other (See Comments)    Outpatient Encounter Medications as of 04/02/2021  Medication Sig   albuterol (VENTOLIN HFA) 108 (90 Base) MCG/ACT inhaler Inhale 2 puffs into the lungs every 6 (six) hours as needed for wheezing or shortness of breath.   ALPRAZolam (XANAX) 0.25 MG tablet Take 1 tablet (0.25 mg total) by mouth 3 (three) times daily as needed for anxiety.   BIOTIN PO Take 2,500 mg by mouth daily. [facility].   citalopram (CELEXA) 40 MG tablet Take 1 tablet (40 mg total) by mouth daily. (prescribed by Psych)   Cyanocobalamin (VITAMIN B12 PO) Take 1 tablet by mouth as needed (when patient remembers to take).   Glucosamine-Chondroitin (MOVE FREE PO) Take 1 tablet by mouth as needed (when patient remembers to take).   lisdexamfetamine (VYVANSE) 20 MG capsule Take 1 capsule (20 mg total) by mouth daily.   losartan (COZAAR) 100 MG tablet Take 1 tablet (100 mg total) by mouth daily.   lurasidone (LATUDA) 20 MG TABS tablet Take 1 tablet (20 mg total) by mouth at bedtime.   metoprolol succinate (TOPROL-XL)  25 MG 24 hr tablet TAKE 1 TABLET BY MOUTH EVERY DAY ALONG WITH THE $Remove'50MG'NfJVdqP$  TO TOTAL $Remove'75MG'UDplfKn$  DAILY   metoprolol succinate (TOPROL-XL) 50 MG 24 hr tablet 1 TABLET ONCE DAILY ALONG WITH $RemoveBe'25MG'unGcqSEqZ$  TO TOTAL $Remove'75MG'NGaFGoK$  DAILY. TAKE WITH OR IMMEDIATELY FOLLOWING A MEAL   ondansetron (ZOFRAN) 4 MG tablet Take 1 tablet (4 mg total) by mouth every 8 (eight) hours as needed for nausea or vomiting.   OVER THE COUNTER MEDICATION Take 1 capsule by mouth daily. [facility]   OVER THE COUNTER MEDICATION Take 1,000 mg by mouth daily. Tumeric   testosterone cypionate (DEPOTESTOSTERONE  CYPIONATE) 200 MG/ML injection Inject 0.5 mg into the muscle every 7 (seven) days. No bottle shown for this medication   VITAMIN D PO Take 1 tablet by mouth as needed (when patient remembers to take).   zolpidem (AMBIEN) 10 MG tablet TAKE 1 TABLET BY MOUTH EVERYDAY AT BEDTIME   No facility-administered encounter medications on file as of 04/02/2021.    Review of Systems  Constitutional:  Negative for appetite change, chills, fatigue, fever and unexpected weight change.  HENT:  Negative for congestion, dental problem, ear discharge, ear pain, facial swelling, hearing loss, nosebleeds, postnasal drip, rhinorrhea, sinus pressure, sinus pain, sneezing, sore throat, tinnitus and trouble swallowing.   Eyes:  Negative for pain, discharge, redness, itching and visual disturbance.  Respiratory:  Negative for cough, chest tightness, shortness of breath and wheezing.   Cardiovascular:  Negative for chest pain, palpitations and leg swelling.  Gastrointestinal:  Positive for abdominal pain, blood in stool and nausea. Negative for abdominal distention, constipation, diarrhea and vomiting.       Left side abdominal pain   Endocrine: Negative for cold intolerance, heat intolerance, polydipsia, polyphagia and polyuria.  Genitourinary:  Negative for difficulty urinating, dysuria, flank pain, frequency and urgency.  Musculoskeletal:  Negative for arthralgias, back pain, gait problem, joint swelling, myalgias, neck pain and neck stiffness.  Skin:  Negative for color change, pallor, rash and wound.  Neurological:  Negative for dizziness, syncope, speech difficulty, weakness, light-headedness, numbness and headaches.  Hematological:  Does not bruise/bleed easily.  Psychiatric/Behavioral:  Negative for agitation, behavioral problems, confusion, hallucinations, self-injury, sleep disturbance and suicidal ideas. The patient is nervous/anxious.    Immunization History  Administered Date(s) Administered   PFIZER(Purple  Top)SARS-COV-2 Vaccination 05/26/2020, 06/16/2020   Pertinent  Health Maintenance Due  Topic Date Due   PAP SMEAR-Modifier  Never done   COLONOSCOPY (Pts 45-51yrs Insurance coverage will need to be confirmed)  Never done   INFLUENZA VACCINE  04/09/2021   MAMMOGRAM  04/05/2022   Fall Risk  04/02/2021 02/19/2021 08/22/2020 07/31/2020 04/14/2020  Falls in the past year? 0 0 0 0 0  Number falls in past yr: 0 0 0 0 0  Injury with Fall? 0 0 0 0 0  Risk for fall due to : No Fall Risks - - - -  Follow up Falls evaluation completed - - - -   Functional Status Survey:    Vitals:   04/02/21 1308  BP: 130/90  Pulse: 78  Resp: 16  Temp: (!) 97.5 F (36.4 C)  SpO2: 98%  Weight: 143 lb 9.6 oz (65.1 kg)  Height: $Remove'5\' 3"'KeOuZYn$  (1.6 m)   Body mass index is 25.44 kg/m. Physical Exam Vitals reviewed. Exam conducted with a chaperone present [facility],CMA).  Constitutional:      General: She is not in acute distress.    Appearance: Normal appearance. She is overweight.  She is not ill-appearing or diaphoretic.  HENT:     Head: Normocephalic.     Mouth/Throat:     Mouth: Mucous membranes are moist.     Pharynx: Oropharynx is clear. No oropharyngeal exudate or posterior oropharyngeal erythema.  Eyes:     General: No scleral icterus.       Right eye: No discharge.        Left eye: No discharge.     Extraocular Movements: Extraocular movements intact.     Conjunctiva/sclera: Conjunctivae normal.     Pupils: Pupils are equal, round, and reactive to light.  Neck:     Vascular: No carotid bruit.  Cardiovascular:     Rate and Rhythm: Normal rate and regular rhythm.     Pulses: Normal pulses.     Heart sounds: Normal heart sounds. No murmur heard.   No friction rub. No gallop.  Pulmonary:     Effort: Pulmonary effort is normal. No respiratory distress.     Breath sounds: Normal breath sounds. No wheezing, rhonchi or rales.  Chest:     Chest wall: No tenderness.  Abdominal:     General: Bowel  sounds are normal. There is no distension.     Palpations: Abdomen is soft. There is no mass.     Tenderness: There is no abdominal tenderness. There is no right CVA tenderness, left CVA tenderness, guarding or rebound.     Comments: Left side upper Quadrant reducible hernia   Genitourinary:    Exam position: Knee-chest position.     Rectum: Normal. Guaiac result negative.  Musculoskeletal:        General: No swelling or tenderness. Normal range of motion.     Cervical back: Normal range of motion. No rigidity or tenderness.     Right lower leg: No edema.     Left lower leg: No edema.  Lymphadenopathy:     Cervical: No cervical adenopathy.  Skin:    General: Skin is warm and dry.     Coloration: Skin is not pale.     Findings: No bruising, erythema, lesion or rash.  Neurological:     Mental Status: She is alert and oriented to person, place, and time.     Cranial Nerves: No cranial nerve deficit.     Sensory: No sensory deficit.     Motor: No weakness.     Coordination: Coordination normal.     Gait: Gait normal.  Psychiatric:        Mood and Affect: Mood is anxious.        Speech: Speech normal.        Behavior: Behavior normal.        Thought Content: Thought content normal.        Judgment: Judgment normal.    Labs reviewed: Recent Labs    07/31/20 0823 03/30/21 1601 04/02/21 1331  NA 140 138 137  K 4.6 3.9 4.6  CL 106 104 103  CO2 $Re'24 25 25  'qgA$ GLUCOSE 85 101* 86  BUN $Re'13 13 10  'ekr$ CREATININE 0.95 1.03* 0.89  CALCIUM 9.3 9.1 9.7   Recent Labs    07/31/20 0823 03/30/21 1601  AST 15 21  ALT 14 15  ALKPHOS  --  29*  BILITOT 0.7 1.2  PROT 6.8 7.4  ALBUMIN  --  4.0   Recent Labs    08/02/20 1344 03/30/21 1601 04/02/21 1331  WBC 9.2 11.4* 10.7  NEUTROABS 4,692 7.3 7,158  HGB 15.9* 16.4* 16.5*  HCT 47.3* 48.1* 48.1*  MCV 89.8 90.8 91.4  PLT 388 357 373   Lab Results  Component Value Date   TSH 1.66 07/31/2020   No results found for: HGBA1C Lab Results   Component Value Date   CHOL 157 07/31/2020   HDL 38 (L) 07/31/2020   LDLCALC 88 07/31/2020   TRIG 214 (H) 07/31/2020   CHOLHDL 4.1 07/31/2020    Significant Diagnostic Results in last 30 days:  CT ABDOMEN PELVIS WO CONTRAST  Result Date: 04/04/2021 CLINICAL DATA:  Possible hernia, worsening left side abdomen bulge EXAM: CT ABDOMEN AND PELVIS WITHOUT CONTRAST TECHNIQUE: Multidetector CT imaging of the abdomen and pelvis was performed following the standard protocol without IV contrast. COMPARISON:  None. FINDINGS: Lower chest: No acute abnormality. Partially imaged bilateral breast implants. Hepatobiliary: No focal liver abnormality is seen. No gallstones, gallbladder wall thickening, or biliary dilatation. Pancreas: Unremarkable. No pancreatic ductal dilatation or surrounding inflammatory changes. Spleen: Normal in size without focal abnormality. Adrenals/Urinary Tract: Adrenals are unremarkable. Left nephrectomy. Right kidney is unremarkable. Bladder is poorly distended. Stomach/Bowel: Stomach is within normal limits. Bowel is normal in caliber. Normal appendix. Vascular/Lymphatic: Trace aortic atherosclerosis. No enlarged lymph nodes. Reproductive: No pelvic mass. Other: No free fluid. A fat containing left spigelian hernia is present. Musculoskeletal: There is atrophy of the left abdominal wall muscles. No significant osseous abnormality. IMPRESSION: Fat containing left spigelian hernia. Atrophy of the left abdominal wall muscles. Electronically Signed   By: Macy Mis M.D.   On: 04/04/2021 15:41    Assessment/Plan  1. Leukocytosis, unspecified type WBC 11.4 in ED but left without being seen after waiting for 6 hrs  Will rule out other acute etiologies.  - Urine specimen send for culture made aware will call in 2-3 days with final urine results  - POC Urinalysis Dipstick - Urine Culture - CBC with Differential/Platelet  2. Nausea Unclear etiology. - BMP with eGFR([facility]) -  ondansetron (ZOFRAN) 4 MG tablet; Take 1 tablet (4 mg total) by mouth every 8 (eight) hours as needed for nausea or vomiting.  Dispense: 20 tablet; Refill: 0 - Notify provider of go to ED if symptoms worsen  3. Symptoms of urinary tract infection - POC Urinalysis Dipstick indicates yellow clear urine with trace blood and large 3+   4. Dark stools Guaiac stool negative.  5. Current moderate episode of major depressive disorder, unspecified whether recurrent ([place]) Has had increased stress level.No SI Declines antidepressant to SE Advised to schedule appointment with her counselor/Psychiatry to evaluate depression   6. Generalized anxiety disorder Reported not taking her Xanax  Advised to take Take Xanax as prescribed for anxiety   Family/ staff Communication: Reviewed plan of care with patient verbalized understanding.  Labs/tests ordered:  - POC Urinalysis Dipstick - Urine Culture - CBC with Differential/Platelet - BMP with eGFR([facility])  Next Appointment: As needed if symptoms worsen or fail to improve    Sandrea Hughs, NP

## 2021-04-03 ENCOUNTER — Ambulatory Visit
Admission: RE | Admit: 2021-04-03 | Discharge: 2021-04-03 | Disposition: A | Discharge status: Home / Self Care | Payer: BC Managed Care – PPO | Source: Ambulatory Visit | Attending: General Surgery | Admitting: General Surgery

## 2021-04-03 DIAGNOSIS — I700 Atherosclerosis of aorta: Secondary | ICD-10-CM | POA: Diagnosis not present

## 2021-04-03 DIAGNOSIS — K439 Ventral hernia without obstruction or gangrene: Secondary | ICD-10-CM | POA: Diagnosis not present

## 2021-04-03 DIAGNOSIS — Z9882 Breast implant status: Secondary | ICD-10-CM | POA: Diagnosis not present

## 2021-04-03 DIAGNOSIS — R109 Unspecified abdominal pain: Secondary | ICD-10-CM

## 2021-04-03 DIAGNOSIS — N3289 Other specified disorders of bladder: Secondary | ICD-10-CM | POA: Diagnosis not present

## 2021-04-03 LAB — CBC WITH DIFFERENTIAL/PLATELET
Absolute Monocytes: 642 cells/uL (ref 200–950)
Basophils Absolute: 54 cells/uL (ref 0–200)
Basophils Relative: 0.5 %
Eosinophils Absolute: 96 cells/uL (ref 15–500)
Eosinophils Relative: 0.9 %
HCT: 48.1 % — ABNORMAL HIGH (ref 35.0–45.0)
Hemoglobin: 16.5 g/dL — ABNORMAL HIGH (ref 11.7–15.5)
Lymphs Abs: 2750 cells/uL (ref 850–3900)
MCH: 31.4 pg (ref 27.0–33.0)
MCHC: 34.3 g/dL (ref 32.0–36.0)
MCV: 91.4 fL (ref 80.0–100.0)
MPV: 10.9 fL (ref 7.5–12.5)
Monocytes Relative: 6 %
Neutro Abs: 7158 cells/uL (ref 1500–7800)
Neutrophils Relative %: 66.9 %
Platelets: 373 10*3/uL (ref 140–400)
RBC: 5.26 10*6/uL — ABNORMAL HIGH (ref 3.80–5.10)
RDW: 12.9 % (ref 11.0–15.0)
Total Lymphocyte: 25.7 %
WBC: 10.7 10*3/uL (ref 3.8–10.8)

## 2021-04-03 LAB — BASIC METABOLIC PANEL WITH GFR
BUN: 10 mg/dL (ref 7–25)
CO2: 25 mmol/L (ref 20–32)
Calcium: 9.7 mg/dL (ref 8.6–10.4)
Chloride: 103 mmol/L (ref 98–110)
Creat: 0.89 mg/dL (ref 0.50–1.03)
Glucose, Bld: 86 mg/dL (ref 65–99)
Potassium: 4.6 mmol/L (ref 3.5–5.3)
Sodium: 137 mmol/L (ref 135–146)
eGFR: 79 mL/min/{1.73_m2} (ref >=60)

## 2021-04-03 LAB — URINE CULTURE
MICRO NUMBER:: 12159388
Result:: NO GROWTH
SPECIMEN QUALITY:: ADEQUATE

## 2021-04-12 ENCOUNTER — Telehealth: Payer: Self-pay

## 2021-04-12 DIAGNOSIS — Z1231 Encounter for screening mammogram for malignant neoplasm of breast: Secondary | ICD-10-CM | POA: Diagnosis not present

## 2021-04-12 NOTE — Telephone Encounter (Signed)
Patient called and left message requesting information about starting her disability process.   Called patient, LMOM to get with her job or social security first then schedule an apnt for forms to be filled out.

## 2021-04-27 ENCOUNTER — Ambulatory Visit: Payer: PRIVATE HEALTH INSURANCE | Admitting: Adult Health

## 2021-04-30 ENCOUNTER — Ambulatory Visit: Payer: BC Managed Care – PPO | Admitting: Psychiatry

## 2021-05-02 DIAGNOSIS — K432 Incisional hernia without obstruction or gangrene: Secondary | ICD-10-CM | POA: Diagnosis not present

## 2021-05-15 ENCOUNTER — Other Ambulatory Visit: Payer: Self-pay

## 2021-05-15 ENCOUNTER — Ambulatory Visit (INDEPENDENT_AMBULATORY_CARE_PROVIDER_SITE_OTHER): Payer: PRIVATE HEALTH INSURANCE | Admitting: Psychiatry

## 2021-05-15 ENCOUNTER — Ambulatory Visit: Payer: PRIVATE HEALTH INSURANCE | Admitting: Adult Health

## 2021-05-15 ENCOUNTER — Encounter: Payer: Self-pay | Admitting: Adult Health

## 2021-05-15 DIAGNOSIS — F331 Major depressive disorder, recurrent, moderate: Secondary | ICD-10-CM

## 2021-05-15 DIAGNOSIS — F411 Generalized anxiety disorder: Secondary | ICD-10-CM | POA: Diagnosis not present

## 2021-05-15 DIAGNOSIS — F410 Panic disorder [episodic paroxysmal anxiety] without agoraphobia: Secondary | ICD-10-CM | POA: Diagnosis not present

## 2021-05-15 DIAGNOSIS — F909 Attention-deficit hyperactivity disorder, unspecified type: Secondary | ICD-10-CM | POA: Diagnosis not present

## 2021-05-15 DIAGNOSIS — G4700 Insomnia, unspecified: Secondary | ICD-10-CM

## 2021-05-15 MED ORDER — DESVENLAFAXINE SUCCINATE ER 25 MG PO TB24: 25.0000 mg | ORAL_TABLET | Freq: Every day | ORAL | 2 refills | Status: DC

## 2021-05-15 NOTE — Progress Notes (Signed)
[facility] Counselor/Therapist Progress Note  Patient ID: Christina Leonard, MRN: [id number],    Date: 05/15/2021  Time Spent: 57 minutes   Treatment Type: Individual Therapy  Reported Symptoms: depression, anxiety, "going through a major life change right now that is causing a lot of depression/anxiety/crying/not motivated/feeling lost. Denies any SI.  Mental Status Exam:  Appearance:   Casual     Behavior:  Appropriate and Sharing  Motor:  Normal  Speech/Language:   Clear and Coherent  Affect:  Depressed, Tearful, and anxious  Mood:  anxious, depressed, and sad  Thought process:  goal directed  Thought content:    WNL  Sensory/Perceptual disturbances:    WNL  Orientation:  oriented to person, place, time/date, situation, day of week, month of year, year, and stated date of Sept. 6, 2022  Attention:  Good  Concentration:  Fair  Memory:  WNL  Fund of knowledge:   Good  Insight:    Good  Judgment:   Fair  Impulse Control:  Fair   Risk Assessment: Danger to Self:  No Self-injurious Behavior: No Danger to Others: No Duty to Warn:no Physical Aggression / Violence:No  Access to Firearms a concern: No  Gang Involvement:No   Subjective:   Patient in today following up from her initial evaluation.  Husband has been staying outside the home and she wonders if "there may be somebody else involved".  He is definitely not wanting to reunite per her report but is saying that he will pay expenses if she moves to [place].He states he is looking for work in another states, but wants to remain in [place] unsure about moving away.  Feeling worthless and abandoned.  Does not feel her daughters have much support for her.  Needing session today to process her feelings on loss, uncertainty, sadness, fears, and hurt. Also dealing with some spinal stenosis issues, high BP, and 1 kidney. Is to see Yvette Rack, DNP later today re: further medication eval. working on grief issues regarding the  relationship with she and her husband and having lots of feelings that she has been encouraged to deal with more directly that will help her reach a point of hopefully making better decisions that she is needing to make for herself.  Trust issues are of a concern as well as next steps, which for her is more belief in herself and working through and resolving some emotions in order to make next steps.  Also processed some of what may be going on for her adult daughters and how she might communicate with them in a way that will be more mutually helpful.  Interventions: Solution-Oriented/Positive Psychology and Insight-Oriented  Diagnosis:   ICD-10-CM   1. Major depressive disorder, recurrent episode, moderate (HCC)  F33.1        Treatment Goal Plan: Patient not signing treatment plan on computer screen due to Covid. Treatment Goals: Treatment goals remain on tx plan as patient works with strategies to achieve her goals. Progress is assessed each session and documented in  "Progress" section of Plan. Long Term Goal: Decrease depression from an 8/9 to at least a 2/3 on a 1-10 scale with 10 being the highest. Develop healthy cognitive patterns and beliefs about self and the world that lead to alleviation and help prevent relapse of depressive symptoms. Short Term Goal: Verbally express understanding of the relationship between depressed mood and repression of feelings including anger hurt and sadness. Strategy: Encouraged sharing feelings of depression in order to clarify  them and gain insight as to causes.    Plan:  Patient today showing low motivation and denies any SI.  Distraught over her husband leaving her several weeks ago and wondering if another woman may be involved.  He is not willing to discuss it with her but has agreed to pay her expenses if she would move back to [place].  Patient states that she does not get close to people so she does not have very many friends and is struggling  right now with mixed emotions and decisions to make.  Is unsure about whether to consult with an attorney but is trying to make that decision along with others in her best interest.  Encouraged her to let others that she does not know in the area, be of support to her and also how she might communicate with her adult daughters that might work better for her and them.  Is continuing to work through a lot of emotions and has appointment later today about medication that might benefit her at this point.  Will return again within 2 weeks.  Goal review and progress/challenges noted with patient.    Next appointment within 2 weeks.   Mathis Fare, LCSW

## 2021-05-15 NOTE — Progress Notes (Signed)
Christina Leonard [id number] 09-19-1970 50 y.o.  Subjective:   Patient ID:  Christina Leonard is a 50 y.o. (DOB [birth year]) female.  Chief Complaint: No chief complaint on file.   HPI Christina Leonard presents to the office today for follow-up of MDD, ADHD, GAD, insomnia, and panic attacks.  Describes mood today as "not very good". Pleasant. Mood symptoms - reports increased depression. Feels anxious and irritated. Having issues within marriage. She and husband have separated. Stating "I have no idea why he wants to separate". Stating "I don't know what I did wrong". Husband wanting her to move to [place] to be close 2 daughters.  Decreased interest and motivation. Taking medications as prescribed.  Energy levels decreased. Active, does not have a regular exercise routine. Enjoys some usual interests and activities. Married. Recently separated from husband x 25 years - 3 dog and 2 cats. Two daughters 67 and 20. Daughters are in [place]. Moved to [place] from [place] for husband's job. Spending time with family. Appetite adequate. Weight gain - 142 to 145 pounds. Sleeps well most nights. Averages 8 hours with Ambien - waking up some during the night. Focus and concentration difficulties. Not able to get things done - "hard for me to go from one thing to another". Taking care of animals. Had not been completing self care - started today. Completing tasks. Managing minimal aspects of household. Unemployed. Denies SI or HI.  Denies AH or VH.  One kidney x 12 years ago - working with PCP to manage BP.  Flowsheet Row ED from 03/30/2021 in [facility] EMERGENCY DEPARTMENT  C-SSRS RISK CATEGORY No Risk        Review of Systems:  Review of Systems  Musculoskeletal:  Negative for gait problem.  Neurological:  Negative for tremors.  Psychiatric/Behavioral:         Please refer to HPI   Medications: I have reviewed the patient's current medications.  Current Outpatient Medications  Medication  Sig Dispense Refill   Desvenlafaxine Succinate ER (PRISTIQ) 25 MG TB24 Take 25 mg by mouth daily. 30 tablet 2   albuterol (VENTOLIN HFA) 108 (90 Base) MCG/ACT inhaler Inhale 2 puffs into the lungs every 6 (six) hours as needed for wheezing or shortness of breath. 8 g 3   ALPRAZolam (XANAX) 0.25 MG tablet Take 1 tablet (0.25 mg total) by mouth 3 (three) times daily as needed for anxiety. 90 tablet 2   BIOTIN PO Take 2,500 mg by mouth daily. [facility].     citalopram (CELEXA) 40 MG tablet Take 1 tablet (40 mg total) by mouth daily. (prescribed by Psych) 90 tablet 1   Cyanocobalamin (VITAMIN B12 PO) Take 1 tablet by mouth as needed (when patient remembers to take).     Glucosamine-Chondroitin (MOVE FREE PO) Take 1 tablet by mouth as needed (when patient remembers to take).     lisdexamfetamine (VYVANSE) 20 MG capsule Take 1 capsule (20 mg total) by mouth daily. 30 capsule 0   losartan (COZAAR) 100 MG tablet Take 1 tablet (100 mg total) by mouth daily. 90 tablet 1   metoprolol succinate (TOPROL-XL) 25 MG 24 hr tablet TAKE 1 TABLET BY MOUTH EVERY DAY ALONG WITH THE 50MG  TO TOTAL 75MG  DAILY 90 tablet 1   metoprolol succinate (TOPROL-XL) 50 MG 24 hr tablet 1 TABLET ONCE DAILY ALONG WITH 25MG  TO TOTAL 75MG  DAILY. TAKE WITH OR IMMEDIATELY FOLLOWING A MEAL 90 tablet 1   ondansetron (ZOFRAN) 4 MG tablet Take 1  tablet (4 mg total) by mouth every 8 (eight) hours as needed for nausea or vomiting. 20 tablet 0   OVER THE COUNTER MEDICATION Take 1 capsule by mouth daily. [facility]     OVER THE COUNTER MEDICATION Take 1,000 mg by mouth daily. Tumeric     testosterone cypionate (DEPOTESTOSTERONE CYPIONATE) 200 MG/ML injection Inject 0.5 mg into the muscle every 7 (seven) days. No bottle shown for this medication     VITAMIN D PO Take 1 tablet by mouth as needed (when patient remembers to take).     zolpidem (AMBIEN) 10 MG tablet TAKE 1 TABLET BY MOUTH EVERYDAY AT BEDTIME 30 tablet 2   No  current facility-administered medications for this visit.    Medication Side Effects: None  Allergies:  Allergies  Allergen Reactions   Benzocaine     Other reaction(s): Other (See Comments)   Benzocaine-Menthol    Cholecalciferol    Other     Other reaction(s): Unknown   Skin Adhesives [Cyanoacrylate]    Chlorhexidine Rash and Hives   Codeine Other (See Comments)    Stomach problems patient reports   Iodinated Diagnostic Agents Rash   Penicillins Rash   Sulfa Antibiotics Rash and Other (See Comments)    Other reaction(s): Unknown   Tape Rash    Past Medical History:  Diagnosis Date   Anxiety    Arthritis    Chronic kidney disease 2008   Nephrectomy    Depression    High blood pressure    High cholesterol    Insomnia     Past Medical History, Surgical history, Social history, and Family history were reviewed and updated as appropriate.   Please see review of systems for further details on the patient's review from today.   Objective:   Physical Exam:  There were no vitals taken for this visit.  Physical Exam Constitutional:      General: She is not in acute distress. Musculoskeletal:        General: No deformity.  Neurological:     Mental Status: She is alert and oriented to person, place, and time.     Coordination: Coordination normal.  Psychiatric:        Attention and Perception: Attention and perception normal. She does not perceive auditory or visual hallucinations.        Mood and Affect: Mood normal. Mood is not anxious or depressed. Affect is not labile, blunt, angry or inappropriate.        Speech: Speech normal.        Behavior: Behavior normal.        Thought Content: Thought content normal. Thought content is not paranoid or delusional. Thought content does not include homicidal or suicidal ideation. Thought content does not include homicidal or suicidal plan.        Cognition and Memory: Cognition and memory normal.        Judgment: Judgment  normal.     Comments: Insight intact    Lab Review:     Component Value Date/Time   NA 137 04/02/2021 1331   K 4.6 04/02/2021 1331   CL 103 04/02/2021 1331   CO2 25 04/02/2021 1331   GLUCOSE 86 04/02/2021 1331   BUN 10 04/02/2021 1331   CREATININE 0.89 04/02/2021 1331   CALCIUM 9.7 04/02/2021 1331   PROT 7.4 03/30/2021 1601   ALBUMIN 4.0 03/30/2021 1601   AST 21 03/30/2021 1601   ALT 15 03/30/2021 1601   ALKPHOS 29 (L)  03/30/2021 1601   BILITOT 1.2 03/30/2021 1601   GFRNONAA >60 03/30/2021 1601   GFRNONAA 70 07/31/2020 0823   GFRAA 82 07/31/2020 0823       Component Value Date/Time   WBC 10.7 04/02/2021 1331   RBC 5.26 (H) 04/02/2021 1331   HGB 16.5 (H) 04/02/2021 1331   HCT 48.1 (H) 04/02/2021 1331   PLT 373 04/02/2021 1331   MCV 91.4 04/02/2021 1331   MCH 31.4 04/02/2021 1331   MCHC 34.3 04/02/2021 1331   RDW 12.9 04/02/2021 1331   LYMPHSABS 2,750 04/02/2021 1331   MONOABS 0.7 03/30/2021 1601   EOSABS 96 04/02/2021 1331   BASOSABS 54 04/02/2021 1331    No results found for: POCLITH, LITHIUM   No results found for: PHENYTOIN, PHENOBARB, VALPROATE, CBMZ   .res Assessment: Plan:    Plan:  PDMP reviewed  1. Celexa 40mg  daily 2. Ambien 10mg  daily 3. Xanax 0.25mg  daily to three times daily 4. Add Pristiq 25mg  every morning   [facility] ADHD test 37/58 ADHD possible  RTC 4 weeks  Patient advised to contact office with any questions, adverse effects, or acute worsening in signs and symptoms.  Discussed potential benefits, risks, and side effects of stimulants with patient to include increased heart rate, palpitations, insomnia, increased anxiety, increased irritability, or decreased appetite.  Instructed patient to contact office if experiencing any significant tolerability issues.  Discussed potential benefits, risk, and side effects of benzodiazepines to include potential risk of tolerance and dependence, as well as possible drowsiness.  Advised  patient not to drive if experiencing drowsiness and to take lowest possible effective dose to minimize risk of dependence and tolerance  Diagnoses and all orders for this visit:  Major depressive disorder, recurrent episode, moderate (HCC) -     Desvenlafaxine Succinate ER (PRISTIQ) 25 MG TB24; Take 25 mg by mouth daily.  Attention deficit hyperactivity disorder (ADHD), unspecified ADHD type  Generalized anxiety disorder  Panic attacks  Insomnia, unspecified type    Please see After Visit Summary for patient specific instructions.  Future Appointments  Date Time Provider Department Center  05/29/2021 12:00 PM , LCSW [facility] None  06/12/2021 12:00 PM 05/31/2021, LCSW [facility] None  07/13/2021  1:45 PM Hilty, 08/12/2021, MD [facility]  08/24/2021  9:00 AM Ngetich, 13/12/2020, NP [facility] None    No orders of the defined types were placed in this encounter.   -------------------------------

## 2021-05-24 DIAGNOSIS — M25512 Pain in left shoulder: Secondary | ICD-10-CM | POA: Diagnosis not present

## 2021-05-24 DIAGNOSIS — M25562 Pain in left knee: Secondary | ICD-10-CM | POA: Diagnosis not present

## 2021-05-26 ENCOUNTER — Other Ambulatory Visit: Payer: Self-pay | Admitting: Adult Health

## 2021-05-26 DIAGNOSIS — G4700 Insomnia, unspecified: Secondary | ICD-10-CM

## 2021-05-28 NOTE — Telephone Encounter (Signed)
Last filled 8/16 appt on 10/4

## 2021-05-29 ENCOUNTER — Ambulatory Visit: Payer: BC Managed Care – PPO | Admitting: Psychiatry

## 2021-06-12 ENCOUNTER — Encounter: Payer: Self-pay | Admitting: Adult Health

## 2021-06-12 ENCOUNTER — Other Ambulatory Visit: Payer: Self-pay

## 2021-06-12 ENCOUNTER — Ambulatory Visit: Payer: PRIVATE HEALTH INSURANCE | Admitting: Adult Health

## 2021-06-12 ENCOUNTER — Ambulatory Visit: Payer: PRIVATE HEALTH INSURANCE | Admitting: Psychiatry

## 2021-06-12 DIAGNOSIS — F331 Major depressive disorder, recurrent, moderate: Secondary | ICD-10-CM

## 2021-06-12 DIAGNOSIS — G4700 Insomnia, unspecified: Secondary | ICD-10-CM

## 2021-06-12 DIAGNOSIS — F411 Generalized anxiety disorder: Secondary | ICD-10-CM | POA: Diagnosis not present

## 2021-06-12 DIAGNOSIS — F410 Panic disorder [episodic paroxysmal anxiety] without agoraphobia: Secondary | ICD-10-CM

## 2021-06-12 DIAGNOSIS — F909 Attention-deficit hyperactivity disorder, unspecified type: Secondary | ICD-10-CM

## 2021-06-12 NOTE — Progress Notes (Signed)
[facility] Counselor/Therapist Progress Note  Patient ID: Christina Leonard, MRN: [id number],    Date: 06/12/2021  Time Spent: 56 minutes   Treatment Type: Individual Therapy  Reported Symptoms: Depression , anxiety, tearfulness  Mental Status Exam:  Appearance:   Casual     Behavior:  Appropriate, Sharing, and some motivation  Motor:  Normal  Speech/Language:   Clear and Coherent  Affect:  Depressed and anxious  Mood:  anxious and depressed  Thought process:  goal directed  Thought content:    Obsessions and overthinking  Sensory/Perceptual disturbances:    WNL  Orientation:  oriented to person, place, time/date, situation, day of week, month of year, year, and stated date of Oct. 4, 2022  Attention:  Fair  Concentration:  Fair  Memory:  Forgets sometimes and writes things down to help  Progress Energy of knowledge:   Good  Insight:    Good and Fair  Judgment:   Good and Fair  Impulse Control:  Good and Fair   Risk Assessment: Danger to Self:  No Self-injurious Behavior: No Danger to Others: No Duty to Warn:no Physical Aggression / Violence:No  Access to Firearms a concern: No  Gang Involvement:No   Subjective:   Patient in today reporting depression, anxiety, tearfulness and denies any SI. "Every medicine I've tried has some type of problem and it's not helping." Mad and sad and feels rejected when husband does not answer her calls. States she is remembering the good side of him and now sees him as "not knowing this new avoidant side of him."  Feels husband is staying with someone else now and is not being upfront with her.  Feels distrust, anger, sadness, fears, loss, uncertainty, hurt, and feeling abandoned, as well as grief about the ending of her relationship with husband. "Felt similarly as a child when mom placed me with grandparents and later was given to my father." Lots of challenges during those transitions. Now feeling "discarded" again. Denies any SI. Needed session time  today to working through some of her feelings as noted above and feel heard, supported, and able to move forward.  Plans to check in with legal aid office with some questions they can help answer.  Was feeling some more calm and grounded by end of session today, feeling heard and supported.   Interventions: Solution-Oriented/Positive Psychology, Ego-Supportive, and Insight-Oriented  Diagnosis:   ICD-10-CM   1. Major depressive disorder, recurrent episode, moderate (HCC)  F33.1       Treatment Goal Plan: Patient not signing treatment plan on computer screen due to Covid. Treatment Goals: Treatment goals remain on tx plan as patient works with strategies to achieve her goals. Progress is assessed each session and documented in  "Progress" section of Plan. Long Term Goal: Decrease depression from an 8/9 to at least a 2/3 on a 1-10 scale with 10 being the highest. Develop healthy cognitive patterns and beliefs about self and the world that lead to alleviation and help prevent relapse of depressive symptoms. Short Term Goal: Verbally express understanding of the relationship between depressed mood and repression of feelings including anger hurt and sadness. Strategy: Encouraged sharing feelings of depression in order to clarify them and gain insight as to causes.   Plan:   Patient today showing some motivation initially and did become more motivated over the course of session as we worked together on some issues from her past as well as present as noted above.  Was very emotionally upset today  over her situation and able to share a lot more of her troubled past as well as her difficult situation currently in the present.  Distraught but it seemed to help her to have a safe confidential place to vent a lot of troubling thoughts and feelings.  Continues to deny any SI.  Encouraged patient to be in touch with people that are in her life for some support, and to continue working on how she might  communicate with her adult daughters and ways that might be more productive for all of them.  Also encouraged patient to try not to assume the worst especially at a time when she is not clear of what all is happening in her relationship, to believe in herself more, to practice good self-care, to reach out and get the professional care she may need to have information that she needs at this time, to get outside some daily, to have and set appropriate boundaries with others, to practice more consistent positive self talk, and to refrain from self blame.  Goal review and progress/challenges noted with patient.  Next appt within 2 weeks.  This record has been created using [employer].  Chart creation errors have been sought, but may not always have been located and corrected.  Such creation errors do not reflect on the standard of medical care provided.    Mathis Fare, LCSW

## 2021-06-12 NOTE — Progress Notes (Signed)
Christina Leonard [id number] [birth year] 50 y.o.  Subjective:   Patient ID:  Christina Leonard is a 50 y.o. (DOB 25-Aug-1971) female.  Chief Complaint: No chief complaint on file.   HPI Argelia Formisano presents to the office today for follow-up of MDD, ADHD, GAD, insomnia, and panic attacks.  Describes mood today as "about the same". Pleasant. Mood symptoms - reports depression, anxiety and irritability. Stating "I feel like I have no purpose right now". Having panic attacks in the evening time - "that's when he would come home". Not wanting to go to [place] - daughters not understanding. Does not want them to see her down. Stating "I don't know what way to go". She and husband remain separated - minimal contact. Has blocked her from talking to him at times. Stating "nothing is making any sense, nothing". Lacking any motivation. Self doubting. Has gotten through because of her faith - "one prayer at a time". Did not tolerate Pristiq - "it made things worse". .Decreased interest and motivation. Taking medications as prescribed.  Energy levels decreased. Active, does not have a regular exercise routine. Unable to enjoy usual interests and activities. Married. Recently separated from husband x 25 years. Two daughters 5 and 32. Daughters are in [place]. Moved to [place] from [place] for husband's job. Spending time with family. Appetite adequate. Weight loss - 142  pounds. Sleep disturbance - panic throughout the day and night - feels scared. Averages 8 hours with Ambien - "on and off". Focus and concentration difficulties - "not good". Completing tasks. Able to take care for animals - 2 dogs. Had to give her parrot of 11 years away. Completing self care. Managing aspects of household. Unemployed. Denies SI or HI.  Denies AH or VH.  One kidney x 12 years ago - working with PCP to manage BP.  Flowsheet Row ED from 03/30/2021 in [facility] EMERGENCY DEPARTMENT  C-SSRS RISK CATEGORY No Risk         Review of Systems:  Review of Systems  Musculoskeletal:  Negative for gait problem.  Neurological:  Negative for tremors.  Psychiatric/Behavioral:         Please refer to HPI   Medications: I have reviewed the patient's current medications.  Current Outpatient Medications  Medication Sig Dispense Refill   albuterol (VENTOLIN HFA) 108 (90 Base) MCG/ACT inhaler Inhale 2 puffs into the lungs every 6 (six) hours as needed for wheezing or shortness of breath. 8 g 3   ALPRAZolam (XANAX) 0.25 MG tablet Take 1 tablet (0.25 mg total) by mouth 3 (three) times daily as needed for anxiety. 90 tablet 2   BIOTIN PO Take 2,500 mg by mouth daily. [facility].     citalopram (CELEXA) 40 MG tablet Take 1 tablet (40 mg total) by mouth daily. (prescribed by Psych) 90 tablet 1   Cyanocobalamin (VITAMIN B12 PO) Take 1 tablet by mouth as needed (when patient remembers to take).     Desvenlafaxine Succinate ER (PRISTIQ) 25 MG TB24 Take 25 mg by mouth daily. 30 tablet 2   Glucosamine-Chondroitin (MOVE FREE PO) Take 1 tablet by mouth as needed (when patient remembers to take).     lisdexamfetamine (VYVANSE) 20 MG capsule Take 1 capsule (20 mg total) by mouth daily. 30 capsule 0   losartan (COZAAR) 100 MG tablet Take 1 tablet (100 mg total) by mouth daily. 90 tablet 1   metoprolol succinate (TOPROL-XL) 25 MG 24 hr tablet TAKE 1 TABLET BY MOUTH EVERY DAY  ALONG WITH THE 50MG  TO TOTAL 75MG  DAILY 90 tablet 1   metoprolol succinate (TOPROL-XL) 50 MG 24 hr tablet 1 TABLET ONCE DAILY ALONG WITH 25MG  TO TOTAL 75MG  DAILY. TAKE WITH OR IMMEDIATELY FOLLOWING A MEAL 90 tablet 1   ondansetron (ZOFRAN) 4 MG tablet Take 1 tablet (4 mg total) by mouth every 8 (eight) hours as needed for nausea or vomiting. 20 tablet 0   OVER THE COUNTER MEDICATION Take 1 capsule by mouth daily. [facility]     OVER THE COUNTER MEDICATION Take 1,000 mg by mouth daily. Tumeric     testosterone cypionate (DEPOTESTOSTERONE  CYPIONATE) 200 MG/ML injection Inject 0.5 mg into the muscle every 7 (seven) days. No bottle shown for this medication     VITAMIN D PO Take 1 tablet by mouth as needed (when patient remembers to take).     zolpidem (AMBIEN) 10 MG tablet TAKE 1 TABLET BY MOUTH EVERYDAY AT BEDTIME 30 tablet 0   No current facility-administered medications for this visit.    Medication Side Effects: None  Allergies:  Allergies  Allergen Reactions   Benzocaine     Other reaction(s): Other (See Comments)   Benzocaine-Menthol    Cholecalciferol    Other     Other reaction(s): Unknown   Skin Adhesives [Cyanoacrylate]    Chlorhexidine Rash and Hives   Codeine Other (See Comments)    Stomach problems patient reports   Iodinated Diagnostic Agents Rash   Penicillins Rash   Sulfa Antibiotics Rash and Other (See Comments)    Other reaction(s): Unknown   Tape Rash    Past Medical History:  Diagnosis Date   Anxiety    Arthritis    Chronic kidney disease 2008   Nephrectomy    Depression    High blood pressure    High cholesterol    Insomnia     Past Medical History, Surgical history, Social history, and Family history were reviewed and updated as appropriate.   Please see review of systems for further details on the patient's review from today.   Objective:   Physical Exam:  There were no vitals taken for this visit.  Physical Exam Constitutional:      General: She is not in acute distress. Musculoskeletal:        General: No deformity.  Neurological:     Mental Status: She is alert and oriented to person, place, and time.     Coordination: Coordination normal.  Psychiatric:        Attention and Perception: Attention and perception normal. She does not perceive auditory or visual hallucinations.        Mood and Affect: Mood normal. Mood is not anxious or depressed. Affect is not labile, blunt, angry or inappropriate.        Speech: Speech normal.        Behavior: Behavior normal.         Thought Content: Thought content normal. Thought content is not paranoid or delusional. Thought content does not include homicidal or suicidal ideation. Thought content does not include homicidal or suicidal plan.        Cognition and Memory: Cognition and memory normal.        Judgment: Judgment normal.     Comments: Insight intact    Lab Review:     Component Value Date/Time   NA 137 04/02/2021 1331   K 4.6 04/02/2021 1331   CL 103 04/02/2021 1331   CO2 25 04/02/2021 1331  GLUCOSE 86 04/02/2021 1331   BUN 10 04/02/2021 1331   CREATININE 0.89 04/02/2021 1331   CALCIUM 9.7 04/02/2021 1331   PROT 7.4 03/30/2021 1601   ALBUMIN 4.0 03/30/2021 1601   AST 21 03/30/2021 1601   ALT 15 03/30/2021 1601   ALKPHOS 29 (L) 03/30/2021 1601   BILITOT 1.2 03/30/2021 1601   GFRNONAA >60 03/30/2021 1601   GFRNONAA 70 07/31/2020 0823   GFRAA 82 07/31/2020 0823       Component Value Date/Time   WBC 10.7 04/02/2021 1331   RBC 5.26 (H) 04/02/2021 1331   HGB 16.5 (H) 04/02/2021 1331   HCT 48.1 (H) 04/02/2021 1331   PLT 373 04/02/2021 1331   MCV 91.4 04/02/2021 1331   MCH 31.4 04/02/2021 1331   MCHC 34.3 04/02/2021 1331   RDW 12.9 04/02/2021 1331   LYMPHSABS 2,750 04/02/2021 1331   MONOABS 0.7 03/30/2021 1601   EOSABS 96 04/02/2021 1331   BASOSABS 54 04/02/2021 1331    No results found for: POCLITH, LITHIUM   No results found for: PHENYTOIN, PHENOBARB, VALPROATE, CBMZ   .res Assessment: Plan:    Plan:  PDMP reviewed  1. Celexa 40mg  daily 2. Ambien 10mg  daily 3. Xanax 0.25mg  daily to three times daily  Add Lybalvi 5mg  at bedtime  D/C Pristiq 25mg  every morning - did not tolerate - mage me more tearful   [facility] ADHD test 37/58 ADHD possible  RTC 4 weeks  Patient advised to contact office with any questions, adverse effects, or acute worsening in signs and symptoms.  Discussed potential benefits, risks, and side effects of stimulants with patient to include  increased heart rate, palpitations, insomnia, increased anxiety, increased irritability, or decreased appetite.  Instructed patient to contact office if experiencing any significant tolerability issues.  Discussed potential benefits, risk, and side effects of benzodiazepines to include potential risk of tolerance and dependence, as well as possible drowsiness.  Advised patient not to drive if experiencing drowsiness and to take lowest possible effective dose to minimize risk of dependence and tolerance   Diagnoses and all orders for this visit:  Major depressive disorder, recurrent episode, moderate (HCC)  Generalized anxiety disorder  Attention deficit hyperactivity disorder (ADHD), unspecified ADHD type  Insomnia, unspecified type  Panic attacks    Please see After Visit Summary for patient specific instructions.  Future Appointments  Date Time Provider Department Center  07/13/2021  1:45 PM , MD [facility]  07/17/2021 12:00 PM 11-16-1981, LCSW [facility] None  08/24/2021  9:00 AM Ngetich, Chrystie Nose, NP [facility] None    No orders of the defined types were placed in this encounter.   -------------------------------

## 2021-06-19 ENCOUNTER — Telehealth: Payer: Self-pay | Admitting: Adult Health

## 2021-06-19 NOTE — Telephone Encounter (Signed)
Patient lvm stating that she has been taking Lybalvi 5mg  and since has been experiening lots of shaking. She also is super hungry all the time feels like she is eating for three. "Can't keep eating like this." She would like to know if she should stop taking this and if she could start taking something else.Pls rtc [phone]

## 2021-06-19 NOTE — Telephone Encounter (Signed)
Have her d/c the Lybalvi and call with an update.

## 2021-06-19 NOTE — Telephone Encounter (Signed)
LVM with info and to rtc with any questions  

## 2021-06-19 NOTE — Telephone Encounter (Signed)
Please review

## 2021-06-22 ENCOUNTER — Ambulatory Visit (INDEPENDENT_AMBULATORY_CARE_PROVIDER_SITE_OTHER): Payer: BC Managed Care – PPO | Admitting: Family

## 2021-06-22 ENCOUNTER — Encounter: Payer: Self-pay | Admitting: Family

## 2021-06-22 ENCOUNTER — Other Ambulatory Visit: Payer: Self-pay

## 2021-06-22 DIAGNOSIS — H02402 Unspecified ptosis of left eyelid: Secondary | ICD-10-CM

## 2021-06-22 DIAGNOSIS — R220 Localized swelling, mass and lump, head: Secondary | ICD-10-CM | POA: Diagnosis not present

## 2021-06-22 DIAGNOSIS — F331 Major depressive disorder, recurrent, moderate: Secondary | ICD-10-CM

## 2021-06-22 NOTE — Progress Notes (Signed)
This service is provided via telemedicine  No vital signs collected/recorded due to the encounter was a telemedicine visit.   Location of patient (ex: home, work):  Home.  Patient consents to a telephone visit:  Yes  Location of the provider (ex: office, home):  [employer].  Name of any referring provider:  Elowyn Raupp, Donalee Citrin, NP   Names of all persons participating in the telemedicine service and their role in the encounter:  Patient, Meda Klinefelter, RMA, Claud Gowan, Carilyn Goodpasture, NP.    Time spent on call: 8 minutes spent on the phone with Medical Assistant.      Provider: Richarda Blade FNP-C  Alieah Brinton, Donalee Citrin, NP  Patient Care Team: Caley Volkert, Donalee Citrin, NP as PCP - General (Family Medicine)  Extended Emergency Contact Information Primary Emergency Contact: yazmina, pareja Mobile Phone: [phone] Relation: Spouse  Code Status:  Full Code  Goals of care: Advanced Directive information Advanced Directives 06/22/2021  Does Patient Have a Medical Advance Directive? No  Would patient like information on creating a medical advance directive? No - Patient declined     Chief Complaint  Patient presents with   Acute Visit    Patient complains of depression, stress, and sinusitis.    Concern     High Risk Fall    HPI:  Pt is a 50 y.o. female seen today for an acute visit for evaluation of left side face swollen.left arm feels heavy.left eye feels droopy.No issues with her speech or weakness on the legs.symptoms started yesterday.wonders whether swollen face could be sinus related.Difficult to determine on telephone visit.  Has constant headache. Had severe pressure on chest yesterday from crying a lot.States husband moved out about few months ago.Has been very stressed out and depressed.  Denies any fever,chills or cough.   Past Medical History:  Diagnosis Date   Anxiety    Arthritis    Chronic kidney disease 2008   Nephrectomy    Depression    High blood  pressure    High cholesterol    Insomnia    Past Surgical History:  Procedure Laterality Date   NEPHRECTOMY  2008   Done in [place]   VAGINAL HYSTERECTOMY  2010   Dr. Ernie Hew    Allergies  Allergen Reactions   Benzocaine     Other reaction(s): Other (See Comments)   Benzocaine-Menthol    Cholecalciferol    Hydrocodone Other (See Comments)   Other     Other reaction(s): Unknown Other reaction(s): Unknown   Skin Adhesives [Cyanoacrylate]    Chlorhexidine Rash and Hives   Codeine Other (See Comments)    Stomach problems patient reports   Iodinated Diagnostic Agents Rash   Penicillins Rash   Sulfa Antibiotics Rash and Other (See Comments)    Other reaction(s): Unknown   Tape Rash    Outpatient Encounter Medications as of 06/22/2021  Medication Sig   albuterol (VENTOLIN HFA) 108 (90 Base) MCG/ACT inhaler Inhale 2 puffs into the lungs every 6 (six) hours as needed for wheezing or shortness of breath.   ALPRAZolam (XANAX) 0.25 MG tablet Take 1 tablet (0.25 mg total) by mouth 3 (three) times daily as needed for anxiety.   BIOTIN PO Take 2,500 mg by mouth daily. [facility].   citalopram (CELEXA) 40 MG tablet Take 1 tablet (40 mg total) by mouth daily. (prescribed by Psych)   Cyanocobalamin (VITAMIN B12 PO) Take 1 tablet by mouth as needed (when patient remembers to take).  Desvenlafaxine Succinate ER (PRISTIQ) 25 MG TB24 Take 25 mg by mouth daily.   Glucosamine-Chondroitin (MOVE FREE PO) Take 1 tablet by mouth as needed (when patient remembers to take).   lisdexamfetamine (VYVANSE) 20 MG capsule Take 1 capsule (20 mg total) by mouth daily.   losartan (COZAAR) 100 MG tablet Take 1 tablet (100 mg total) by mouth daily.   metoprolol succinate (TOPROL-XL) 25 MG 24 hr tablet TAKE 1 TABLET BY MOUTH EVERY DAY ALONG WITH THE 50MG  TO TOTAL 75MG  DAILY   metoprolol succinate (TOPROL-XL) 50 MG 24 hr tablet 1 TABLET ONCE DAILY ALONG WITH 25MG  TO TOTAL 75MG  DAILY. TAKE WITH  OR IMMEDIATELY FOLLOWING A MEAL   ondansetron (ZOFRAN) 4 MG tablet Take 1 tablet (4 mg total) by mouth every 8 (eight) hours as needed for nausea or vomiting.   OVER THE COUNTER MEDICATION Take 1 capsule by mouth daily. [facility]   OVER THE COUNTER MEDICATION Take 1,000 mg by mouth daily. Tumeric   testosterone cypionate (DEPOTESTOSTERONE CYPIONATE) 200 MG/ML injection Inject 0.5 mg into the muscle every 7 (seven) days. No bottle shown for this medication   VITAMIN D PO Take 1 tablet by mouth as needed (when patient remembers to take).   zolpidem (AMBIEN) 10 MG tablet TAKE 1 TABLET BY MOUTH EVERYDAY AT BEDTIME   No facility-administered encounter medications on file as of 06/22/2021.    Review of Systems  Constitutional:  Negative for appetite change, chills, fatigue, fever and unexpected weight change.  HENT:  Negative for congestion, dental problem, ear discharge, ear pain, facial swelling, hearing loss, nosebleeds, postnasal drip, rhinorrhea, sinus pressure, sinus pain, sneezing, sore throat, tinnitus and trouble swallowing.   Eyes:  Negative for pain, discharge, redness, itching and visual disturbance.  Respiratory:  Negative for cough, chest tightness, shortness of breath and wheezing.   Cardiovascular:  Negative for chest pain, palpitations and leg swelling.  Gastrointestinal:  Negative for abdominal distention, abdominal pain, constipation, diarrhea, nausea and vomiting.  Musculoskeletal:  Negative for arthralgias, back pain, gait problem, joint swelling, myalgias, neck pain and neck stiffness.  Skin:  Negative for color change, pallor, rash and wound.  Neurological:  Positive for facial asymmetry. Negative for dizziness, syncope, speech difficulty, weakness, light-headedness and headaches.       Left side face swollen,left eye drooping and left arm heaviness.   Psychiatric/Behavioral:  Negative for agitation, behavioral problems, confusion, hallucinations, self-injury, sleep  disturbance and suicidal ideas. The patient is not nervous/anxious.        Increased depression and stressed out.Husband moved out couple of months ago.    Immunization History  Administered Date(s) Administered   PFIZER(Purple Top)SARS-COV-2 Vaccination 05/26/2020, 06/16/2020   Pertinent  Health Maintenance Due  Topic Date Due   PAP SMEAR-Modifier  Never done   COLONOSCOPY (Pts 45-54yrs Insurance coverage will need to be confirmed)  Never done   INFLUENZA VACCINE  Never done   MAMMOGRAM  04/05/2022   Fall Risk  06/22/2021 04/02/2021 02/19/2021 08/22/2020 07/31/2020  Falls in the past year? 1 0 0 0 0  Number falls in past yr: 1 0 0 0 0  Injury with Fall? 1 0 0 0 0  Risk for fall due to : History of fall(s) No Fall Risks - - -  Follow up Falls evaluation completed;Education provided;Falls prevention discussed Falls evaluation completed - - -   Functional Status Survey:    There were no vitals filed for this visit. There is no height or weight on  file to calculate BMI. Physical Exam Unable to complete on telephone visit   Labs reviewed: Recent Labs    07/31/20 0823 03/30/21 1601 04/02/21 1331  NA 140 138 137  K 4.6 3.9 4.6  CL 106 104 103  CO2 24 25 25   GLUCOSE 85 101* 86  BUN 13 13 10   CREATININE 0.95 1.03* 0.89  CALCIUM 9.3 9.1 9.7   Recent Labs    07/31/20 0823 03/30/21 1601  AST 15 21  ALT 14 15  ALKPHOS  --  29*  BILITOT 0.7 1.2  PROT 6.8 7.4  ALBUMIN  --  4.0   Recent Labs    08/02/20 1344 03/30/21 1601 04/02/21 1331  WBC 9.2 11.4* 10.7  NEUTROABS 4,692 7.3 7,158  HGB 15.9* 16.4* 16.5*  HCT 47.3* 48.1* 48.1*  MCV 89.8 90.8 91.4  PLT 388 357 373   Lab Results  Component Value Date   TSH 1.66 07/31/2020   No results found for: HGBA1C Lab Results  Component Value Date   CHOL 157 07/31/2020   HDL 38 (L) 07/31/2020   LDLCALC 88 07/31/2020   TRIG 214 (H) 07/31/2020   CHOLHDL 4.1 07/31/2020    Significant Diagnostic Results in last 30 days:   No results found.  Assessment/Plan  1. Swelling of left side of face New onset since yesterday 06/22/2021 with left drooping and left arm heaviness. Unclear etiology recommended ED evaluation increased stress level verse TIA given presenting symptoms.  2. Drooping eyelid, left Send to ED as above   3. Moderate episode of recurrent major depressive disorder (HCC) Has worsen with Husband moving out.reports crying out yesterday until she had pressure on her chest.  - continue on Celexa,Pristiq and Alprazolam    Family/ staff Communication: Reviewed plan of care with patient verbalized understanding.  Labs/tests ordered: Send to ED for further evaluation   Next Appointment: As needed if symptoms worsen or fail to improve   I connected with  08/02/2020 on 06/22/21 by Telephone enabled telemedicine application and verified that I am speaking with the correct person using two identifiers.   I discussed the limitations of evaluation and management by telemedicine. The patient expressed understanding and agreed to proceed.   Spent 12 minutes of non-face to face with patient  >50% time spent counseling; reviewing medical record; tests; labs; and developing future plan of care.     Enriqueta Shutter, NP

## 2021-06-22 NOTE — Telephone Encounter (Signed)
We can offer Wellbutrin or Cymbalta.

## 2021-06-22 NOTE — Telephone Encounter (Signed)
Lvm to RTC 

## 2021-06-22 NOTE — Telephone Encounter (Signed)
Pt LVM that her symptoms from the meds she was taking are gone, which was excessive hunger and body shaking.  She is requesting to know if there is any other med she can take to help with her depression.   She has zero motivation, crying a lot, no desire to do anything and living by herself right now.  She was 'tearful' on the VM.

## 2021-06-22 NOTE — Telephone Encounter (Signed)
Please review

## 2021-06-26 ENCOUNTER — Other Ambulatory Visit: Payer: Self-pay

## 2021-06-26 MED ORDER — DULOXETINE HCL 30 MG PO CPEP: ORAL_CAPSULE | ORAL | 0 refills | Status: DC

## 2021-06-26 NOTE — Telephone Encounter (Signed)
We can add 30mg  every day for a week and then increase to 60mg  if tolerating.

## 2021-06-26 NOTE — Telephone Encounter (Signed)
RX SENT

## 2021-06-26 NOTE — Telephone Encounter (Signed)
Pt will like to try cymbalta

## 2021-07-03 ENCOUNTER — Ambulatory Visit: Payer: BC Managed Care – PPO | Admitting: Family

## 2021-07-05 ENCOUNTER — Other Ambulatory Visit: Payer: Self-pay

## 2021-07-05 ENCOUNTER — Encounter: Payer: Self-pay | Admitting: Family

## 2021-07-05 ENCOUNTER — Ambulatory Visit: Payer: BC Managed Care – PPO | Admitting: Family

## 2021-07-05 VITALS — BP 110/78 | HR 75 | Temp 97.7°F | Resp 16 | Ht 63.0 in | Wt 143.0 lb

## 2021-07-05 DIAGNOSIS — R32 Unspecified urinary incontinence: Secondary | ICD-10-CM | POA: Insufficient documentation

## 2021-07-05 DIAGNOSIS — H579 Unspecified disorder of eye and adnexa: Secondary | ICD-10-CM | POA: Diagnosis not present

## 2021-07-05 DIAGNOSIS — I10 Essential (primary) hypertension: Secondary | ICD-10-CM | POA: Diagnosis not present

## 2021-07-05 DIAGNOSIS — R202 Paresthesia of skin: Secondary | ICD-10-CM | POA: Diagnosis not present

## 2021-07-05 DIAGNOSIS — N8189 Other female genital prolapse: Secondary | ICD-10-CM | POA: Insufficient documentation

## 2021-07-05 DIAGNOSIS — N959 Unspecified menopausal and perimenopausal disorder: Secondary | ICD-10-CM | POA: Insufficient documentation

## 2021-07-05 DIAGNOSIS — R200 Anesthesia of skin: Secondary | ICD-10-CM | POA: Diagnosis not present

## 2021-07-05 DIAGNOSIS — N393 Stress incontinence (female) (male): Secondary | ICD-10-CM | POA: Insufficient documentation

## 2021-07-05 DIAGNOSIS — R209 Unspecified disturbances of skin sensation: Secondary | ICD-10-CM | POA: Insufficient documentation

## 2021-07-05 DIAGNOSIS — Z780 Asymptomatic menopausal state: Secondary | ICD-10-CM | POA: Insufficient documentation

## 2021-07-05 NOTE — Progress Notes (Signed)
Provider: Richarda Blade FNP-C  Brandy Zuba, Donalee Citrin, NP  Patient Care Team: Torri Langston, Donalee Citrin, NP as PCP - General (Family Medicine)  Extended Emergency Contact Information Primary Emergency Contact: kalesha, irving Mobile Phone: [phone] Relation: Spouse  Code Status:  full code Goals of care: Advanced Directive information Advanced Directives 07/05/2021  Does Patient Have a Medical Advance Directive? No  Would patient like information on creating a medical advance directive? No - Patient declined     Chief Complaint  Patient presents with   Acute Visit    Complains of issues with left eye.    HPI:  Pt is a 50 y.o. female seen today for an acute visit for evaluation of left eye pressure x 2 weeks.states has been crying a lot since Husband left her recently.Has been more depressed.Follows up with Psychiatrist states was recently started on Duloxetine but does not want to take it.States Has not picked up duloxetine from her pharmacy. Describes left as Blurry vision and feels like " Pressure above and inside the eye.Has whitish crust on the corner of the eye when she wakes up in the morning.she denies any eye redness or matting eyelashes.   Also states left face feels large than the right and feels numb since eye pressure started.denies any drooping or changes in speech.No weakness of extremities. Had a video visit with provider on 06/22/2021 for similar eye pressure and left side face numbness.she was advised to go to the ED but states did not go.  Has had some tiredness which she thinks it's due to her metoprolol.she has stopped metoprolol 75 mg tablet.Her Blood pressure is within normal range today.discussed with her to check blood pressure at home and record to restart metoprolol 25 mg tablet daily if BP> 140/90 consistently for three days.    Past Medical History:  Diagnosis Date   Anxiety    Arthritis    Chronic kidney disease 2008   Nephrectomy    Depression    High  blood pressure    High cholesterol    Insomnia    Past Surgical History:  Procedure Laterality Date   NEPHRECTOMY  2008   Done in [place]   VAGINAL HYSTERECTOMY  2010   Dr. Ernie Hew    Allergies  Allergen Reactions   Benzocaine     Other reaction(s): Other (See Comments)   Benzocaine-Menthol    Cholecalciferol    Hydrocodone Other (See Comments)   Other     Other reaction(s): Unknown Other reaction(s): Unknown   Skin Adhesives [Cyanoacrylate]    Chlorhexidine Rash and Hives   Codeine Other (See Comments)    Stomach problems patient reports   Iodinated Diagnostic Agents Rash   Penicillins Rash   Sulfa Antibiotics Rash and Other (See Comments)    Other reaction(s): Unknown   Tape Rash    Outpatient Encounter Medications as of 07/05/2021  Medication Sig   albuterol (VENTOLIN HFA) 108 (90 Base) MCG/ACT inhaler Inhale 2 puffs into the lungs every 6 (six) hours as needed for wheezing or shortness of breath.   ALPRAZolam (XANAX) 0.25 MG tablet Take 1 tablet (0.25 mg total) by mouth 3 (three) times daily as needed for anxiety.   BIOTIN PO Take 2,500 mg by mouth daily. [facility].   citalopram (CELEXA) 40 MG tablet Take 1 tablet (40 mg total) by mouth daily. (prescribed by Psych)   Cyanocobalamin (VITAMIN B12 PO) Take 1 tablet by mouth as needed (when patient remembers to take).  Desvenlafaxine Succinate ER (PRISTIQ) 25 MG TB24 Take 25 mg by mouth daily.   DULoxetine (CYMBALTA) 30 MG capsule TAKE 1 CAPSULE DAILY FOR 7 DAYS,THEN INCREASE TO 2 CAPSULES DAILY.   Glucosamine-Chondroitin (MOVE FREE PO) Take 1 tablet by mouth as needed (when patient remembers to take).   lisdexamfetamine (VYVANSE) 20 MG capsule Take 1 capsule (20 mg total) by mouth daily.   losartan (COZAAR) 100 MG tablet Take 1 tablet (100 mg total) by mouth daily.   metoprolol succinate (TOPROL-XL) 25 MG 24 hr tablet TAKE 1 TABLET BY MOUTH EVERY DAY ALONG WITH THE 50MG  TO TOTAL 75MG  DAILY    metoprolol succinate (TOPROL-XL) 50 MG 24 hr tablet 1 TABLET ONCE DAILY ALONG WITH 25MG  TO TOTAL 75MG  DAILY. TAKE WITH OR IMMEDIATELY FOLLOWING A MEAL   ondansetron (ZOFRAN) 4 MG tablet Take 1 tablet (4 mg total) by mouth every 8 (eight) hours as needed for nausea or vomiting.   OVER THE COUNTER MEDICATION Take 1 capsule by mouth daily. [facility]   OVER THE COUNTER MEDICATION Take 1,000 mg by mouth daily. Tumeric   testosterone cypionate (DEPOTESTOSTERONE CYPIONATE) 200 MG/ML injection Inject 0.5 mg into the muscle every 7 (seven) days. No bottle shown for this medication   VITAMIN D PO Take 1 tablet by mouth as needed (when patient remembers to take).   zolpidem (AMBIEN) 10 MG tablet TAKE 1 TABLET BY MOUTH EVERYDAY AT BEDTIME   No facility-administered encounter medications on file as of 07/05/2021.    Review of Systems  Constitutional:  Negative for appetite change, chills, fatigue, fever and unexpected weight change.  HENT:  Negative for congestion, ear discharge, ear pain, facial swelling, hearing loss, postnasal drip, rhinorrhea, sinus pressure, sinus pain, sneezing and sore throat.   Eyes:  Positive for visual disturbance. Negative for pain, discharge, redness and itching.       Reports blurry vision  Left eye pressure above the eye and inside   Respiratory:  Negative for cough, chest tightness, shortness of breath and wheezing.   Cardiovascular:  Negative for chest pain, palpitations and leg swelling.  Gastrointestinal:  Negative for vomiting.  Musculoskeletal:  Negative for arthralgias, back pain, gait problem, joint swelling, myalgias, neck pain and neck stiffness.  Skin:  Negative for color change, pallor and rash.  Neurological:  Negative for dizziness, syncope, speech difficulty, weakness, light-headedness, numbness and headaches.  Hematological:  Does not bruise/bleed easily.  Psychiatric/Behavioral:  Negative for agitation, behavioral problems, confusion, hallucinations,  self-injury, sleep disturbance and suicidal ideas. The patient is not nervous/anxious.        Depressed    Immunization History  Administered Date(s) Administered   PFIZER(Purple Top)SARS-COV-2 Vaccination 05/26/2020, 06/16/2020   Pertinent  Health Maintenance Due  Topic Date Due   PAP SMEAR-Modifier  Never done   COLONOSCOPY (Pts 45-31yrs Insurance coverage will need to be confirmed)  Never done   INFLUENZA VACCINE  Never done   MAMMOGRAM  04/05/2022   Fall Risk 02/19/2021 03/30/2021 04/02/2021 06/22/2021 07/05/2021  Falls in the past year? 0 - 0 1 0  Was there an injury with Fall? 0 - 0 1 0  Fall Risk Category Calculator 0 - 0 3 0  Fall Risk Category Low - Low High Low  Patient Fall Risk Level Low fall risk Low fall risk Low fall risk High fall risk Low fall risk  Patient at Risk for Falls Due to - - No Fall Risks History of fall(s) No Fall Risks  Fall risk  Follow up - - Falls evaluation completed Falls evaluation completed;Education provided;Falls prevention discussed Falls evaluation completed   Functional Status Survey:    Vitals:   07/05/21 1546  BP: 110/78  Pulse: 75  Resp: 16  Temp: 97.7 F (36.5 C)  SpO2: 98%  Weight: 143 lb (64.9 kg)  Height: 5\' 3"  (1.6 m)   Body mass index is 25.33 kg/m. Physical Exam Vitals reviewed.  Constitutional:      General: She is not in acute distress.    Appearance: Normal appearance. She is normal weight. She is not ill-appearing or diaphoretic.  HENT:     Head: Normocephalic.     Nose: Nose normal. No congestion or rhinorrhea.     Mouth/Throat:     Mouth: Mucous membranes are moist.     Pharynx: Oropharynx is clear. No oropharyngeal exudate or posterior oropharyngeal erythema.  Eyes:     General: No scleral icterus.       Right eye: No discharge.        Left eye: No discharge.     Extraocular Movements: Extraocular movements intact.     Conjunctiva/sclera: Conjunctivae normal.     Pupils: Pupils are equal, round, and  reactive to light.  Neck:     Vascular: No carotid bruit.  Cardiovascular:     Rate and Rhythm: Normal rate and regular rhythm.     Pulses: Normal pulses.     Heart sounds: Normal heart sounds. No murmur heard.   No friction rub. No gallop.  Pulmonary:     Effort: Pulmonary effort is normal. No respiratory distress.     Breath sounds: Normal breath sounds. No wheezing, rhonchi or rales.  Chest:     Chest wall: No tenderness.  Musculoskeletal:     Cervical back: Normal range of motion. No rigidity or tenderness.  Lymphadenopathy:     Cervical: No cervical adenopathy.  Skin:    General: Skin is warm and dry.     Coloration: Skin is not pale.     Findings: No bruising, erythema, lesion or rash.  Neurological:     Mental Status: She is alert and oriented to person, place, and time.     Cranial Nerves: No cranial nerve deficit.     Sensory: No sensory deficit.     Motor: No weakness.     Coordination: Coordination normal.     Gait: Gait normal.  Psychiatric:        Mood and Affect: Mood is depressed.        Speech: Speech normal.        Behavior: Behavior normal.        Thought Content: Thought content normal.        Judgment: Judgment normal.    Labs reviewed: Recent Labs    07/31/20 0823 03/30/21 1601 04/02/21 1331  NA 140 138 137  K 4.6 3.9 4.6  CL 106 104 103  CO2 24 25 25   GLUCOSE 85 101* 86  BUN 13 13 10   CREATININE 0.95 1.03* 0.89  CALCIUM 9.3 9.1 9.7   Recent Labs    07/31/20 0823 03/30/21 1601  AST 15 21  ALT 14 15  ALKPHOS  --  29*  BILITOT 0.7 1.2  PROT 6.8 7.4  ALBUMIN  --  4.0   Recent Labs    08/02/20 1344 03/30/21 1601 04/02/21 1331  WBC 9.2 11.4* 10.7  NEUTROABS 4,692 7.3 7,158  HGB 15.9* 16.4* 16.5*  HCT 47.3* 48.1* 48.1*  MCV 89.8  90.8 91.4  PLT 388 357 373   Lab Results  Component Value Date   TSH 1.66 07/31/2020   No results found for: HGBA1C Lab Results  Component Value Date   CHOL 157 07/31/2020   HDL 38 (L) 07/31/2020    LDLCALC 88 07/31/2020   TRIG 214 (H) 07/31/2020   CHOLHDL 4.1 07/31/2020    Significant Diagnostic Results in last 30 days:  No results found.  Assessment/Plan 1. Eye pressure No signs of infection noted.PERRL Suspect symptoms due to her crying a lot due to separation issues with Husband.request antibiotics but no indications for antibiotics noted during visit.Will get urgent referral to Ophthalmology advised to notify provider for any eye redness or yellow drainage. - Ambulatory referral to Ophthalmology  2. Numbness and tingling of left side of face Has ad symptoms since eye pressure and crying started as above.suspected could be related to crying or depression.  Neuro exam intact.No facial drooping,slurred speech or weakness. - Advised to go to ED if symptoms worsen  - Ambulatory referral to Neurology  3. Hypertension  Blood pressure well controlled.Has stopped her metoprolol states due to feeling tired. - Advised to check Blood pressure at home and record on log provided and notify provider if B/p > 140/90 consistently for three consecutive days  Has follow up appointment with Cardiologist 07/13/2021    Family/ staff Communication: Reviewed plan of care with patient  Labs/tests ordered: None   Next Appointment: As needed if symptoms worsen or fail to improve    Caesar Bookman, NP

## 2021-07-11 ENCOUNTER — Other Ambulatory Visit: Payer: Self-pay | Admitting: Adult Health

## 2021-07-11 ENCOUNTER — Telehealth: Payer: Self-pay | Admitting: Adult Health

## 2021-07-11 DIAGNOSIS — G4700 Insomnia, unspecified: Secondary | ICD-10-CM

## 2021-07-11 NOTE — Telephone Encounter (Signed)
Is she taking the 60mg  dose of Cymbalta?

## 2021-07-11 NOTE — Telephone Encounter (Signed)
Pt LVM requesting if there was anything Almira Coaster could prescribe to help boost her energy in the morning.  She is taking Cymbalta.  She says she need more than "a cup of coffee" type of feeling to get through the day.  She is separating from her husband, but she wants some help figuring out if there is anything else that might her her.  No upcoming appt

## 2021-07-11 NOTE — Telephone Encounter (Signed)
Last filled 9/20 no appt scheduled

## 2021-07-11 NOTE — Telephone Encounter (Signed)
Pended please review message

## 2021-07-11 NOTE — Telephone Encounter (Signed)
Ptcalled back and needs a refill on her [place]. She is completely out

## 2021-07-11 NOTE — Telephone Encounter (Signed)
LVM to let front office know if she is taking 2 capsules daily

## 2021-07-12 NOTE — Telephone Encounter (Signed)
Pt stated she only started taking cymbalta monday so she is not taking 2 capsules daily yet.She has no motivation and is really tired from it.

## 2021-07-12 NOTE — Telephone Encounter (Signed)
Needs to give it more time.

## 2021-07-13 ENCOUNTER — Telehealth (INDEPENDENT_AMBULATORY_CARE_PROVIDER_SITE_OTHER): Payer: BC Managed Care – PPO | Admitting: Internal Medicine

## 2021-07-13 ENCOUNTER — Encounter (HOSPITAL_BASED_OUTPATIENT_CLINIC_OR_DEPARTMENT_OTHER): Payer: Self-pay | Admitting: Internal Medicine

## 2021-07-13 VITALS — BP 143/98 | HR 100 | Ht 63.0 in | Wt 139.6 lb

## 2021-07-13 DIAGNOSIS — I10 Essential (primary) hypertension: Secondary | ICD-10-CM | POA: Diagnosis not present

## 2021-07-13 DIAGNOSIS — E782 Mixed hyperlipidemia: Secondary | ICD-10-CM

## 2021-07-13 NOTE — Telephone Encounter (Signed)
LVM with info

## 2021-07-13 NOTE — Patient Instructions (Signed)
Medication Instructions:  NO CHANGES until Dr. Rennis Golden reviews lab results   *If you need a refill on your cardiac medications before your next appointment, please call your pharmacy*   Lab Work: FASTING lipid panel to check cholesterol   Follow-Up: At [facility], you and your health needs are our priority.  As part of our continuing mission to provide you with exceptional heart care, we have created designated Provider Care Teams.  These Care Teams include your primary Cardiologist (physician) and Advanced Practice Providers (APPs -  Physician Assistants and Nurse Practitioners) who all work together to provide you with the care you need, when you need it.  We recommend signing up for the patient portal called "MyChart".  Sign up information is provided on this After Visit Summary.  MyChart is used to connect with patients for Virtual Visits (Telemedicine).  Patients are able to view lab/test results, encounter notes, upcoming appointments, etc.  Non-urgent messages can be sent to your provider as well.   To learn more about what you can do with MyChart, go to [email or web address].    Your next appointment:   3 month(s)  The format for your next appointment:   In Person or Virtual  Provider:   Dr Zoila Shutter - lipid clinic

## 2021-07-13 NOTE — Progress Notes (Signed)
Virtual Visit via Telephone Note   This visit type was conducted due to national recommendations for restrictions regarding the COVID-19 Pandemic (e.g. social distancing) in an effort to limit this patient's exposure and mitigate transmission in our community.  Due to her co-morbid illnesses, this patient is at least at moderate risk for complications without adequate follow up.  This format is felt to be most appropriate for this patient at this time.  The patient did not have access to video technology/had technical difficulties with video requiring transitioning to audio format only (telephone).  All issues noted in this document were discussed and addressed.  No physical exam could be performed with this format.  Please refer to the patient's chart for her  consent to telehealth for [facility].   Date:  07/13/2021   ID:  Christina Leonard, DOB 1971/08/02, MRN [id number] The patient was identified using 2 identifiers.  Evaluation Performed:  New Patient Evaluation  Patient Location:  [address] [place] [facility] [address]  Provider location:   [address], [address] [place], [place] [address]  PCP:  Caesar Bookman, NP  Cardiologist:  None Electrophysiologist:  None   Chief Complaint:  Manage dyslipdemia  History of Present Illness:    Christina Leonard is a 50 y.o. female who presents via audio/video conferencing for a telehealth visit today.  This is a 50 year old female with a number of medical problems including hypertension and abnormal cholesterol.  She has been tried on statins and fenofibrate in the past both caused pruritus and rash what sounds like hives and possible allergic reaction.  She has been off of statins.  Her lipids a year ago showed total cholesterol 210 with an LDL of 141.  On therapy however, her last cholesterol profile in November 2021 showed total cholesterol of 157 and LDL of 88.  This represents good control but obviously she cannot tolerate the  medicines she will need another option.  I cannot find a more recent lipid profile and it would be required to update that since its been a year since her last numbers.  She has no known coronary disease.  The patient does not have symptoms concerning for COVID-19 infection (fever, chills, cough, or new SHORTNESS OF BREATH).    Prior CV studies:   The following studies were reviewed today:  Chart reviewed, lab work  PMHx:  Past Medical History:  Diagnosis Date   Anxiety    Arthritis    Chronic kidney disease 2008   Nephrectomy    Depression    High blood pressure    High cholesterol    Insomnia     Past Surgical History:  Procedure Laterality Date   NEPHRECTOMY  2008   Done in [place]   VAGINAL HYSTERECTOMY  2010   Dr. Ernie Hew    FAMHx:  Family History  Problem Relation Age of Onset   Osteoporosis Mother    Arthritis Mother     SOCHx:   reports that she has never smoked. She has never used smokeless tobacco. She reports that she does not currently use alcohol. She reports that she does not use drugs.  ALLERGIES:  Allergies  Allergen Reactions   Benzocaine     Other reaction(s): Other (See Comments)   Benzocaine-Menthol    Cholecalciferol    Hydrocodone Other (See Comments)   Other     Other reaction(s): Unknown Other reaction(s): Unknown   Skin Adhesives [Cyanoacrylate]    Chlorhexidine Rash and Hives   Codeine  Other (See Comments)    Stomach problems patient reports   Iodinated Diagnostic Agents Rash   Penicillins Rash   Sulfa Antibiotics Rash and Other (See Comments)    Other reaction(s): Unknown   Tape Rash    MEDS:  Current Meds  Medication Sig   albuterol (VENTOLIN HFA) 108 (90 Base) MCG/ACT inhaler Inhale 2 puffs into the lungs every 6 (six) hours as needed for wheezing or shortness of breath.   ALPRAZolam (XANAX) 0.25 MG tablet Take 1 tablet (0.25 mg total) by mouth 3 (three) times daily as needed for anxiety.   BIOTIN PO Take 2,500 mg  by mouth daily. [facility].   Cyanocobalamin (VITAMIN B12 PO) Take 1 tablet by mouth as needed (when patient remembers to take).   DULoxetine (CYMBALTA) 30 MG capsule TAKE 1 CAPSULE DAILY FOR 7 DAYS,THEN INCREASE TO 2 CAPSULES DAILY.   Glucosamine-Chondroitin (MOVE FREE PO) Take 1 tablet by mouth as needed (when patient remembers to take).   lisdexamfetamine (VYVANSE) 20 MG capsule Take 1 capsule (20 mg total) by mouth daily.   losartan (COZAAR) 100 MG tablet Take 1 tablet (100 mg total) by mouth daily.   ondansetron (ZOFRAN) 4 MG tablet Take 1 tablet (4 mg total) by mouth every 8 (eight) hours as needed for nausea or vomiting.   VITAMIN D PO Take 1 tablet by mouth as needed (when patient remembers to take).   zolpidem (AMBIEN) 10 MG tablet TAKE 1 TABLET BY MOUTH EVERYDAY AT BEDTIME     ROS: A comprehensive review of systems was negative.  Labs/Other Tests and Data Reviewed:    Recent Labs: 07/31/2020: TSH 1.66 03/30/2021: ALT 15 04/02/2021: BUN 10; Creat 0.89; Hemoglobin 16.5; Platelets 373; Potassium 4.6; Sodium 137   Recent Lipid Panel Lab Results  Component Value Date/Time   CHOL 157 07/31/2020 08:23 AM   TRIG 214 (H) 07/31/2020 08:23 AM   HDL 38 (L) 07/31/2020 08:23 AM   CHOLHDL 4.1 07/31/2020 08:23 AM   LDLCALC 88 07/31/2020 08:23 AM    Wt Readings from Last 3 Encounters:  07/13/21 139 lb 9.6 oz (63.3 kg)  07/05/21 143 lb (64.9 kg)  04/02/21 143 lb 9.6 oz (65.1 kg)     Exam:    Vital Signs:  BP (!) 143/98   Pulse 100   Ht 5\' 3"  (1.6 m)   Wt 139 lb 9.6 oz (63.3 kg)   BMI 24.73 kg/m    Exam not performed due to telephone visit  ASSESSMENT & PLAN:    Mixed dyslipidemia, goal LDL less than 100 Statin intolerance-?  Allergic reaction  Ms. Muhlbauer had questionable allergic reaction to statins and fenofibrate in the past.  Her LDL cholesterol without treatment appear to be around 141 but was down to around 88 on therapy.  These labs were from a year ago  and I would advise that we repeat them.  She thinks she may have had more lab work this past year but I cannot find them and will try to reach out to get that.  Otherwise she will need to have repeat labs next week and if they are elevated again, would likely recommend starting ezetimibe 10 mg daily with repeat lipids in 3 to 4 months and a follow-up with me at that time.  Thanks again for the kind referral.  COVID-19 Education: The signs and symptoms of COVID-19 were discussed with the patient and how to seek care for testing (follow up with PCP or  arrange E-visit).  The importance of social distancing was discussed today.  Patient Risk:   After full review of this patients clinical status, I feel that they are at least moderate risk at this time.  Time:   Today, I have spent 25 minutes with the patient with telehealth technology discussing dyslipidemia.     Medication Adjustments/Labs and Tests Ordered: Current medicines are reviewed at length with the patient today.  Concerns regarding medicines are outlined above.   Tests Ordered: Orders Placed This Encounter  Procedures   Lipid panel    Medication Changes: No orders of the defined types were placed in this encounter.   Disposition:  in 4 month(s)  Chrystie Nose, MD, [facility], FACP  [place]  [facility]  Medical Director of the Advanced Lipid Disorders &  Cardiovascular Risk Reduction Clinic Diplomate of the American Board of Clinical Lipidology Attending Cardiologist  Direct Dial: [phone]  Fax: [phone]  Website:  [email or web address]  Chrystie Nose, MD  07/13/2021 2:22 PM

## 2021-07-16 ENCOUNTER — Other Ambulatory Visit: Payer: Self-pay

## 2021-07-16 ENCOUNTER — Ambulatory Visit: Payer: BC Managed Care – PPO | Admitting: Family

## 2021-07-16 ENCOUNTER — Telehealth: Payer: Self-pay | Admitting: Adult Health

## 2021-07-16 ENCOUNTER — Encounter: Payer: Self-pay | Admitting: Family

## 2021-07-16 VITALS — BP 130/90 | HR 77 | Temp 97.3°F | Ht 63.0 in | Wt 139.6 lb

## 2021-07-16 DIAGNOSIS — F331 Major depressive disorder, recurrent, moderate: Secondary | ICD-10-CM

## 2021-07-16 DIAGNOSIS — F5101 Primary insomnia: Secondary | ICD-10-CM

## 2021-07-16 DIAGNOSIS — F411 Generalized anxiety disorder: Secondary | ICD-10-CM

## 2021-07-16 NOTE — Telephone Encounter (Signed)
Thank you for being so quick.

## 2021-07-16 NOTE — Telephone Encounter (Signed)
Please call to get patient in sooner please, per GM.

## 2021-07-16 NOTE — Telephone Encounter (Signed)
Pt scheduled tomorrow

## 2021-07-16 NOTE — Telephone Encounter (Signed)
Pt left a message that she is experiencing side effects from the cymbalta. She said that she is having horrible body and muscle cramps She can barely move, She is taking over the limit of tylenol. Please call her at [phone]

## 2021-07-16 NOTE — Telephone Encounter (Signed)
Can we get her scheduled for an appointment. 

## 2021-07-16 NOTE — Patient Instructions (Signed)
-   Expect phone call from [facility] concerning your anti depressant. - Please go to Ed if symptoms worsen

## 2021-07-16 NOTE — Progress Notes (Signed)
Provider: Marlowe Sax FNP-C  Jakeim Sedore, Nelda Bucks, NP  Patient Care Team: Christepher Melchior, Nelda Bucks, NP as PCP - General (Family Medicine)  Extended Emergency Contact Information Primary Emergency Contact: Christina Leonard Mobile Phone: [phone] Relation: Spouse  Code Status:  Full Code  Goals of care: Advanced Directive information Advanced Directives 07/05/2021  Does Patient Have a Medical Advance Directive? No  Would patient like information on creating a medical advance directive? No - Patient declined     Chief Complaint  Patient presents with   Acute Visit    Patient has pain throughout entire body. She states that it started after taking Cymbalta. Has not had medication in about 4 days. She has been taking Tylenol every 8 hours. Patient states it feels like somebody has tied her up. She does not feel well in general.   Health Maintenance    Hep c screening,zoster vaccine, pap smear, colonoscopy, COVID booster, flu vaccine.    HPI:  Pt is a 50 y.o. female seen today for an acute visit for evaluation of pain on entire body.States started after taking Cymbalta.States was started on Cymbalta one to two weeks ago not sure.Has stopped taking Cymbalta due to generalized body pain " feels like the body has been tied up and twisted " feels like something it's going to happened just feels like want to run I away ". States has been crying  a lot since the husband left her.Feels lonely being in the house by herself.she gets afraid when she goes to the bathroom or getting from one room to the other. Sometimes when the dog burk's she gets up to see if the husband is home. She does not understand why the husband just left her all over sudden.wonders what she did wrong.  Husband leaving her brought bad memories of her past.States her mother abandoned her when she was a baby.then her grandparents abandoned her again when they took her to her real Father. Later met with the Husband and have been  together for 29 years. Has two daughters who live in [place].One works two jobs so she cannot go and live with her.The other daughter lives in her own apartment,has two big doctors and a daughter. States has own two little dogs cannot live in daughter's apartment.  States has no one hear in [place] for emotional support.does not have any friends.Does not work. Husband pays for the house and the bills Request for provider to call her husband and tell him how bad her depression being her emergency contact person states he need to know that she needs him for emotional support while they are going through the separation.states cannot remember the last time she smiled,laughed or sat down to watch a movie. I've discussed with her that this is not an emergency and she is Alert and oriented will need to call husband to discuss issues.  I've discussed with her if she feels like symptoms are worsening will need to go to ED for evaluation possible psychiatry unit admission to stabilize her symptoms but she declines states has two dogs that she has to take care of them.   She had called her Psychiatrist office prior to visit and left message with Gregery Na but has not heard from her.  I was able to communicate with NP via Epic chart stated will call patient.  She denies any suicide ideation or injury to self or others.  Has taken several antidepressant but states all of them gives her side effects. Has been  on Zoloft ,Celexa and Pristiq  States cannot sleep at night despite taking Xanax and Zolpidem   Past Medical History:  Diagnosis Date   Anxiety    Arthritis    Chronic kidney disease 2008   Nephrectomy    Depression    High blood pressure    High cholesterol    Insomnia    Past Surgical History:  Procedure Laterality Date   NEPHRECTOMY  2008   Done in [place]  2010   Dr. Lucia Bitter    Allergies  Allergen Reactions   Benzocaine     Other reaction(s):  Other (See Comments)   Benzocaine-Menthol    Cholecalciferol    Cymbalta [Duloxetine Hcl] Itching   Hydrocodone Other (See Comments)   Other     Other reaction(s): Unknown Other reaction(s): Unknown   Skin Adhesives [Cyanoacrylate]    Chlorhexidine Rash and Hives   Codeine Other (See Comments)    Stomach problems patient reports   Iodinated Diagnostic Agents Rash   Penicillins Rash   Sulfa Antibiotics Rash and Other (See Comments)    Other reaction(s): Unknown   Tape Rash    Outpatient Encounter Medications as of 07/16/2021  Medication Sig   albuterol (VENTOLIN HFA) 108 (90 Base) MCG/ACT inhaler Inhale 2 puffs into the lungs every 6 (six) hours as needed for wheezing or shortness of breath.   ALPRAZolam (XANAX) 0.25 MG tablet Take 1 tablet (0.25 mg total) by mouth 3 (three) times daily as needed for anxiety.   losartan (COZAAR) 100 MG tablet Take 1 tablet (100 mg total) by mouth daily.   VITAMIN D PO Take 1 tablet by mouth as needed (when patient remembers to take).   zolpidem (AMBIEN) 10 MG tablet TAKE 1 TABLET BY MOUTH EVERYDAY AT BEDTIME   citalopram (CELEXA) 40 MG tablet Take 1 tablet (40 mg total) by mouth daily. (prescribed by Psych) (Patient not taking: No sig reported)   metoprolol succinate (TOPROL-XL) 25 MG 24 hr tablet TAKE 1 TABLET BY MOUTH EVERY DAY ALONG WITH THE $Remove'50MG'TknhybG$  TO TOTAL $Remove'75MG'UJJFcsj$  DAILY (Patient not taking: No sig reported)   metoprolol succinate (TOPROL-XL) 50 MG 24 hr tablet 1 TABLET ONCE DAILY ALONG WITH $RemoveBe'25MG'DjNcaZPoL$  TO TOTAL $Remove'75MG'GbcqlwS$  DAILY. TAKE WITH OR IMMEDIATELY FOLLOWING A MEAL (Patient not taking: No sig reported)   ondansetron (ZOFRAN) 4 MG tablet Take 1 tablet (4 mg total) by mouth every 8 (eight) hours as needed for nausea or vomiting. (Patient not taking: Reported on 07/16/2021)   [DISCONTINUED] BIOTIN PO Take 2,500 mg by mouth daily. [facility] Skin[facility] N[facility].   [DISCONTINUED] Cyanocobalamin (VITAMIN B12 PO) Take 1 tablet by mouth as needed (when patient remembers to  take).   [DISCONTINUED] Desvenlafaxine Succinate ER (PRISTIQ) 25 MG TB24 Take 25 mg by mouth daily. (Patient not taking: No sig reported)   [DISCONTINUED] DULoxetine (CYMBALTA) 30 MG capsule TAKE 1 CAPSULE DAILY FOR 7 DAYS,THEN INCREASE TO 2 CAPSULES DAILY.   [DISCONTINUED] Glucosamine-Chondroitin (MOVE FREE PO) Take 1 tablet by mouth as needed (when patient remembers to take).   [DISCONTINUED] lisdexamfetamine (VYVANSE) 20 MG capsule Take 1 capsule (20 mg total) by mouth daily.   [DISCONTINUED] OVER THE COUNTER MEDICATION Take 1 capsule by mouth daily. [facility] (Patient not taking: Reported on 07/13/2021)   [DISCONTINUED] OVER THE COUNTER MEDICATION Take 1,000 mg by mouth daily. Tumeric (Patient not taking: Reported on 07/13/2021)   [DISCONTINUED] testosterone cypionate (DEPOTESTOSTERONE CYPIONATE) 200 MG/ML injection Inject 0.5 mg into  the muscle every 7 (seven) days. No bottle shown for this medication (Patient not taking: Reported on 07/13/2021)   No facility-administered encounter medications on file as of 07/16/2021.    Review of Systems  Constitutional:  Negative for appetite change, chills, fatigue, fever and unexpected weight change.  HENT:  Negative for congestion, dental problem, ear discharge, ear pain, facial swelling, hearing loss, nosebleeds, postnasal drip, rhinorrhea, sinus pressure, sinus pain, sneezing and sore throat.   Eyes:  Negative for pain, discharge, redness, itching and visual disturbance.  Respiratory:  Negative for cough, chest tightness, shortness of breath and wheezing.   Cardiovascular:  Negative for chest pain, palpitations and leg swelling.  Gastrointestinal:  Negative for abdominal distention, abdominal pain, constipation, nausea and vomiting.  Genitourinary:  Negative for difficulty urinating, dysuria, flank pain, frequency and urgency.  Musculoskeletal:  Negative for arthralgias, back pain, gait problem, joint swelling, myalgias, neck pain and neck  stiffness.  Skin:  Negative for color change, pallor, rash and wound.  Neurological:  Negative for dizziness, syncope, speech difficulty, weakness, light-headedness, numbness and headaches.  Hematological:  Does not bruise/bleed easily.  Psychiatric/Behavioral:  Positive for sleep disturbance. Negative for agitation, behavioral problems, confusion, hallucinations, self-injury and suicidal ideas. The patient is nervous/anxious.        Depressed    Immunization History  Administered Date(s) Administered   PFIZER(Purple Top)SARS-COV-2 Vaccination 05/26/2020, 06/16/2020   Pertinent  Health Maintenance Due  Topic Date Due   PAP SMEAR-Modifier  Never done   COLONOSCOPY (Pts 45-14yrs Insurance coverage will need to be confirmed)  Never done   INFLUENZA VACCINE  Never done   MAMMOGRAM  04/05/2022   Fall Risk 02/19/2021 03/30/2021 04/02/2021 06/22/2021 07/05/2021  Falls in the past year? 0 - 0 1 0  Was there an injury with Fall? 0 - 0 1 0  Fall Risk Category Calculator 0 - 0 3 0  Fall Risk Category Low - Low High Low  Patient Fall Risk Level Low fall risk Low fall risk Low fall risk High fall risk Low fall risk  Patient at Risk for Falls Due to - - No Fall Risks History of fall(s) No Fall Risks  Fall risk Follow up - - Falls evaluation completed Falls evaluation completed;Education provided;Falls prevention discussed Falls evaluation completed   Functional Status Survey:    Vitals:   07/16/21 1434  BP: 130/90  Pulse: 77  Temp: (!) 97.3 F (36.3 C)  SpO2: 99%  Weight: 139 lb 9.6 oz (63.3 kg)  Height: $Remove'5\' 3"'aXRcGxC$  (1.6 m)   Body mass index is 24.73 kg/m. Physical Exam Vitals reviewed.  Constitutional:      General: She is not in acute distress.    Appearance: Normal appearance. She is normal weight. She is not ill-appearing or diaphoretic.  HENT:     Head: Normocephalic.  Eyes:     General: No scleral icterus.       Right eye: No discharge.        Left eye: No discharge.      Conjunctiva/sclera: Conjunctivae normal.     Pupils: Pupils are equal, round, and reactive to light.  Neck:     Vascular: No carotid bruit.  Cardiovascular:     Rate and Rhythm: Normal rate and regular rhythm.     Pulses: Normal pulses.     Heart sounds: Normal heart sounds. No murmur heard.   No friction rub. No gallop.  Pulmonary:     Effort: Pulmonary effort is normal. No respiratory  distress.     Breath sounds: Normal breath sounds. No wheezing, rhonchi or rales.  Chest:     Chest wall: No tenderness.  Abdominal:     General: Bowel sounds are normal. There is no distension.     Palpations: Abdomen is soft. There is no mass.     Tenderness: There is no abdominal tenderness. There is no right CVA tenderness, left CVA tenderness, guarding or rebound.  Musculoskeletal:        General: No swelling or tenderness. Normal range of motion.     Cervical back: Normal range of motion. No rigidity or tenderness.     Right lower leg: No edema.     Left lower leg: No edema.  Lymphadenopathy:     Cervical: No cervical adenopathy.  Skin:    General: Skin is warm and dry.     Coloration: Skin is not pale.     Findings: No bruising, erythema, lesion or rash.  Neurological:     Mental Status: She is alert and oriented to person, place, and time.     Cranial Nerves: No cranial nerve deficit.     Sensory: No sensory deficit.     Motor: No weakness.     Coordination: Coordination normal.     Gait: Gait normal.  Psychiatric:        Mood and Affect: Mood is anxious and depressed.        Speech: Speech normal.        Behavior: Behavior normal.        Thought Content: Thought content normal.        Judgment: Judgment normal.    Labs reviewed: Recent Labs    07/31/20 0823 03/30/21 1601 04/02/21 1331  NA 140 138 137  K 4.6 3.9 4.6  CL 106 104 103  CO2 $Re'24 25 25  'jcs$ GLUCOSE 85 101* 86  BUN $Re'13 13 10  'ynf$ CREATININE 0.95 1.03* 0.89  CALCIUM 9.3 9.1 9.7   Recent Labs    07/31/20 0823  03/30/21 1601  AST 15 21  ALT 14 15  ALKPHOS  --  29*  BILITOT 0.7 1.2  PROT 6.8 7.4  ALBUMIN  --  4.0   Recent Labs    08/02/20 1344 03/30/21 1601 04/02/21 1331  WBC 9.2 11.4* 10.7  NEUTROABS 4,692 7.3 7,158  HGB 15.9* 16.4* 16.5*  HCT 47.3* 48.1* 48.1*  MCV 89.8 90.8 91.4  PLT 388 357 373   Lab Results  Component Value Date   TSH 1.66 07/31/2020   No results found for: HGBA1C Lab Results  Component Value Date   CHOL 157 07/31/2020   HDL 38 (L) 07/31/2020   LDLCALC 88 07/31/2020   TRIG 214 (H) 07/31/2020   CHOLHDL 4.1 07/31/2020    Significant Diagnostic Results in last 30 days:  No results found.  Assessment/Plan  1. Moderate episode of recurrent major depressive disorder ([place]) Has stopped her Cymbalta prescribed by her psychiatrist states due to generalized body aches which started after initiation of Cymbalta.  Not taking her Celexa either.  - no suicidal ideation  - Discussed option to send to ED for further evaluation due to not being able to tolerate antidepressant.? may injectable but declined   had contacted her psychiatry provider Rollene Fare Mozingo,NP whom I was able to communicate with her on Epic chart stated will call patient.  Patient notified.   Recommend either daughter to come and stay with her for sometimes or move back to [facility] to be  with her daughters since Husband has moved out and has no other friends or family here but declines since one daughter works two jobs another has two Financial planner and a child.  - states therapy not helping thinks no one listens to her.   2. Generalized anxiety disorder Continue on Alprazolam three times daily as needed   3. Insomnia  Continue on Zolpidem   Family/ staff Communication: Reviewed plan of care with patient verbalized understanding  Labs/tests ordered: None   Next Appointment: As needed if symptoms worsen or fail to improve   Spent 30 minutes of face to face with patient  >50% time spent counseling;  reviewing medical record; tests; labs; and developing future plan of care.    Sandrea Hughs, NP

## 2021-07-16 NOTE — Telephone Encounter (Signed)
I called patient to see what was going on. She states that she is crying all the time and is having fear and panic attacks over all the things going on in her life now. She is stressing and is depressed. She went to see her PCP today and she said they told her she may need to be  hospitalized so she can be stabilized. When asked she denied SI. I know she is separating from her husband and she is evidently living by herself and afraid.   She kept saying she was on Cymbalta, but I don't see it on her med list. She said she didn't start taking it when it was prescribed. She says she is supposed to titrate it up, but I can't get a clear history from her and she is crying. She has Celexa on her med list, which she states she is no longer taking.  She said her bodyaches are so severe she thought she might have COVID, which is why she went to her PCP.   Please advise.

## 2021-07-17 ENCOUNTER — Ambulatory Visit: Payer: PRIVATE HEALTH INSURANCE | Admitting: Psychiatry

## 2021-07-17 ENCOUNTER — Ambulatory Visit (INDEPENDENT_AMBULATORY_CARE_PROVIDER_SITE_OTHER): Payer: PRIVATE HEALTH INSURANCE | Admitting: Adult Health

## 2021-07-17 DIAGNOSIS — F331 Major depressive disorder, recurrent, moderate: Secondary | ICD-10-CM

## 2021-07-17 DIAGNOSIS — G4700 Insomnia, unspecified: Secondary | ICD-10-CM

## 2021-07-17 DIAGNOSIS — F410 Panic disorder [episodic paroxysmal anxiety] without agoraphobia: Secondary | ICD-10-CM | POA: Diagnosis not present

## 2021-07-17 DIAGNOSIS — F411 Generalized anxiety disorder: Secondary | ICD-10-CM | POA: Diagnosis not present

## 2021-07-17 DIAGNOSIS — F909 Attention-deficit hyperactivity disorder, unspecified type: Secondary | ICD-10-CM | POA: Diagnosis not present

## 2021-07-17 NOTE — Progress Notes (Signed)
Christina Leonard [id number] [birth year] 50 y.o.  Subjective:   Patient ID:  Christina Leonard is a 50 y.o. (DOB [birth year]) female.  Chief Complaint: No chief complaint on file.   HPI Christina Leonard presents to the office today for follow-up of MDD, ADHD, GAD, insomnia, and panic attacks.  Describes mood today as "no different". Pleasant. Mood symptoms - reports depression, anxiety and irritability. Did not tolerate the Cymbalta - "I felt terrible with it".  Continues to struglle with the separation from husband. Stating "I feel like I have no purpose right now". Feels like her husband and is seeing someone new. Speaking with husband "some". Husband has unblocked her. Trying to hang on to her marriage. Stating "I don't want to let it go".  Stating "I don't know a life without him. Having panic attacks in the evening time. Feels scared and fearful of everything. Stating "I don't like being a lone in the dark". Would like to try a different medication to help manage mood symptoms. Decreased interest and motivation. Taking medications as prescribed. Energy levels decreased. Active, does not have a regular exercise routine. Unable to enjoy usual interests and activities. Married. Recently separated from husband x 25 years. Two daughters 17 and 42. Daughters are in [place]. Moved to [place] from [place] for husband's job. Spending time with family. Appetite adequate. Weight loss - 3 pounds - 142 to 139 pounds. Sleeps better some nights than others.  Averages 7 hours with Ambien. Focus and concentration difficulties - "not good". Completing tasks. Able to take care for animals - 2 dogs. Had to give her parrot of 11 years away. Completing self care. Managing aspects of household. Unemployed. Denies SI or HI.  Denies AH or VH.  One kidney x 12 years ago - working with PCP to manage BP.  Previous medications: Zoloft, Lexapro, Prozac, Celexa, Pristiq, Abilify - twitches, Pristiq, Cymbalta    Flowsheet Row ED from  03/30/2021 in [facility] EMERGENCY DEPARTMENT  C-SSRS RISK CATEGORY No Risk        Review of Systems:  Review of Systems  Musculoskeletal:  Negative for gait problem.  Neurological:  Negative for tremors.  Psychiatric/Behavioral:         Please refer to HPI   Medications: I have reviewed the patient's current medications.  Current Outpatient Medications  Medication Sig Dispense Refill   albuterol (VENTOLIN HFA) 108 (90 Base) MCG/ACT inhaler Inhale 2 puffs into the lungs every 6 (six) hours as needed for wheezing or shortness of breath. 8 g 3   ALPRAZolam (XANAX) 0.25 MG tablet Take 1 tablet (0.25 mg total) by mouth 3 (three) times daily as needed for anxiety. 90 tablet 2   citalopram (CELEXA) 40 MG tablet Take 1 tablet (40 mg total) by mouth daily. (prescribed by Psych) (Patient not taking: No sig reported) 90 tablet 1   losartan (COZAAR) 100 MG tablet Take 1 tablet (100 mg total) by mouth daily. 90 tablet 1   metoprolol succinate (TOPROL-XL) 25 MG 24 hr tablet TAKE 1 TABLET BY MOUTH EVERY DAY ALONG WITH THE 50MG  TO TOTAL 75MG  DAILY (Patient not taking: No sig reported) 90 tablet 1   metoprolol succinate (TOPROL-XL) 50 MG 24 hr tablet 1 TABLET ONCE DAILY ALONG WITH 25MG  TO TOTAL 75MG  DAILY. TAKE WITH OR IMMEDIATELY FOLLOWING A MEAL (Patient not taking: No sig reported) 90 tablet 1   ondansetron (ZOFRAN) 4 MG tablet Take 1 tablet (4 mg total) by mouth every 8 (eight) hours as needed  for nausea or vomiting. (Patient not taking: Reported on 07/16/2021) 20 tablet 0   VITAMIN D PO Take 1 tablet by mouth as needed (when patient remembers to take).     zolpidem (AMBIEN) 10 MG tablet TAKE 1 TABLET BY MOUTH EVERYDAY AT BEDTIME 30 tablet 0   No current facility-administered medications for this visit.    Medication Side Effects: None  Allergies:  Allergies  Allergen Reactions   Benzocaine     Other reaction(s): Other (See Comments)   Benzocaine-Menthol    Cholecalciferol     Cymbalta [Duloxetine Hcl] Itching   Hydrocodone Other (See Comments)   Other     Other reaction(s): Unknown Other reaction(s): Unknown   Skin Adhesives [Cyanoacrylate]    Chlorhexidine Rash and Hives   Codeine Other (See Comments)    Stomach problems patient reports   Iodinated Diagnostic Agents Rash   Penicillins Rash   Sulfa Antibiotics Rash and Other (See Comments)    Other reaction(s): Unknown   Tape Rash    Past Medical History:  Diagnosis Date   Anxiety    Arthritis    Chronic kidney disease 2008   Nephrectomy    Depression    High blood pressure    High cholesterol    Insomnia     Past Medical History, Surgical history, Social history, and Family history were reviewed and updated as appropriate.   Please see review of systems for further details on the patient's review from today.   Objective:   Physical Exam:  There were no vitals taken for this visit.  Physical Exam Constitutional:      General: She is not in acute distress. Musculoskeletal:        General: No deformity.  Neurological:     Mental Status: She is alert and oriented to person, place, and time.     Coordination: Coordination normal.  Psychiatric:        Attention and Perception: Attention and perception normal. She does not perceive auditory or visual hallucinations.        Mood and Affect: Mood normal. Mood is not anxious or depressed. Affect is not labile, blunt, angry or inappropriate.        Speech: Speech normal.        Behavior: Behavior normal.        Thought Content: Thought content normal. Thought content is not paranoid or delusional. Thought content does not include homicidal or suicidal ideation. Thought content does not include homicidal or suicidal plan.        Cognition and Memory: Cognition and memory normal.        Judgment: Judgment normal.     Comments: Insight intact    Lab Review:     Component Value Date/Time   NA 137 04/02/2021 1331   K 4.6 04/02/2021 1331    CL 103 04/02/2021 1331   CO2 25 04/02/2021 1331   GLUCOSE 86 04/02/2021 1331   BUN 10 04/02/2021 1331   CREATININE 0.89 04/02/2021 1331   CALCIUM 9.7 04/02/2021 1331   PROT 7.4 03/30/2021 1601   ALBUMIN 4.0 03/30/2021 1601   AST 21 03/30/2021 1601   ALT 15 03/30/2021 1601   ALKPHOS 29 (L) 03/30/2021 1601   BILITOT 1.2 03/30/2021 1601   GFRNONAA >60 03/30/2021 1601   GFRNONAA 70 07/31/2020 0823   GFRAA 82 07/31/2020 0823       Component Value Date/Time   WBC 10.7 04/02/2021 1331   RBC 5.26 (H) 04/02/2021 1331  HGB 16.5 (H) 04/02/2021 1331   HCT 48.1 (H) 04/02/2021 1331   PLT 373 04/02/2021 1331   MCV 91.4 04/02/2021 1331   MCH 31.4 04/02/2021 1331   MCHC 34.3 04/02/2021 1331   RDW 12.9 04/02/2021 1331   LYMPHSABS 2,750 04/02/2021 1331   MONOABS 0.7 03/30/2021 1601   EOSABS 96 04/02/2021 1331   BASOSABS 54 04/02/2021 1331    No results found for: POCLITH, LITHIUM   No results found for: PHENYTOIN, PHENOBARB, VALPROATE, CBMZ   .res Assessment: Plan:    Plan:  PDMP reviewed  Continue: Ambien 10mg  daily Xanax 0.25mg  daily to three times daily  Will refer to [facility] for IOP program.  [facility] testing with multiple medication failures.  RTC 4 weeks or sooner as needed.   Diagnoses and all orders for this visit:  Major depressive disorder, recurrent episode, moderate (HCC)  Generalized anxiety disorder  Attention deficit hyperactivity disorder (ADHD), unspecified ADHD type  Panic attacks  Insomnia, unspecified type    Please see After Visit Summary for patient specific instructions.  Future Appointments  Date Time Provider Department Center  08/24/2021  9:00 AM Ngetich, 08/26/2021, NP [facility] None    No orders of the defined types were placed in this encounter.   -------------------------------

## 2021-07-18 ENCOUNTER — Encounter: Payer: Self-pay | Admitting: Adult Health

## 2021-07-19 ENCOUNTER — Telehealth (HOSPITAL_COMMUNITY): Payer: Self-pay | Admitting: Psychiatry

## 2021-07-23 ENCOUNTER — Telehealth: Payer: Self-pay

## 2021-07-23 NOTE — Telephone Encounter (Signed)
Rtc to pt and recommended Latuda, but pt reports she has previously tried that and it gave her SI. She reports it wasn't too long ago when she tried it. I discussed the ones she did try and she confirmed all the ones listed in last office note. I also asked if she has tried Wellbutrin before and she has not if that is one recommended. Informed her I would f/u with her in the morning after I inform Almira Coaster of her reply.

## 2021-07-23 NOTE — Telephone Encounter (Signed)
I will review [facility] testing today and make recommendations. She may also benefit from an IOP at [facility] here in [place] - [phone].

## 2021-07-23 NOTE — Telephone Encounter (Signed)
Genesight testing reviewed. Let's try the Latuda 20mg  at supper with 350 calories.

## 2021-07-23 NOTE — Telephone Encounter (Signed)
She has left them a message today but they have not called back yet. Informed her if she didn't hear back to give me a call back and I would also contact them.  She was/is hoping to hear something as soon as possible about a medication to get started. Informed her Rene Kocher would review her [facility] as soon as she could and we would follow up

## 2021-07-23 NOTE — Telephone Encounter (Signed)
Pt called to check on status of her Gene S[facility] testing results, report is available today for review and copy given to [facility].  Pt reports having a hard time this weekend and almost went to the ER to be assessed. She is afraid she will have to stay in patient too long and that causes her worse anxiety. She reports several situational issues she is having to face, her husband left her, she hasn't been able to see her kids, she had 2 pets die. She did mention a few other things and she feels she is overwhelmed and is just trying to get through the day. All of these factors have occurred in a short time period of 2 months. Pt reports having intrusive thoughts, feelings of darkness. She reports when she goes to the grocery store she gets so upset she has to leave and come back to pay for her groceries. She is unable to function from day to day. She is taking Xanax bid to just get through the day. She did call about IOP but they are only doing virtual at this time and she is requesting in person group setting.  Pt very anxious on the phone. We can set up an apt for her this week to discuss more.  Informed pt I would discuss her symptoms with Almira Coaster and we would follow up with her.

## 2021-07-24 ENCOUNTER — Other Ambulatory Visit: Payer: Self-pay

## 2021-07-24 MED ORDER — BUPROPION HCL ER (XL) 150 MG PO TB24: 150.0000 mg | ORAL_TABLET | Freq: Every day | ORAL | 0 refills | Status: DC

## 2021-07-24 NOTE — Telephone Encounter (Signed)
She did not have suicidal thoughts on the [place] - she noted "emotional swings" with it. Took briefly. We can offer the Wellbutrin XL 150mg  every morning.

## 2021-07-24 NOTE — Telephone Encounter (Signed)
2 weeks. Have her keep [place] updated on the IOP program.

## 2021-07-24 NOTE — Telephone Encounter (Signed)
Thank you. I will contact her with recommendation.  When do you want her to schedule an apt?

## 2021-07-24 NOTE — Telephone Encounter (Signed)
Pt is aware of starting Wellbutrin, given instructions. Also advised her take a Xanax in the morning until things are better stabilized. She is scheduled for follow up on 08/07/21.  New Rx sent for Wellbutrin XL   IOP has not called her back yet, informed her to let me know if she doesn't hear back so I can contact them as well. That's [facility].

## 2021-07-24 NOTE — Telephone Encounter (Signed)
New Rx sent for Wellbutrin XL 150 mg 1 daily #30 to [facility].

## 2021-07-25 ENCOUNTER — Encounter: Payer: Self-pay | Admitting: Adult Health

## 2021-07-26 DIAGNOSIS — M25562 Pain in left knee: Secondary | ICD-10-CM | POA: Diagnosis not present

## 2021-07-26 DIAGNOSIS — M25512 Pain in left shoulder: Secondary | ICD-10-CM | POA: Diagnosis not present

## 2021-07-31 NOTE — Telephone Encounter (Signed)
Follow up call with pt and she reports she feels like the Wellbutrin is worsening her anxiety now. She has been taking for a week and doesn't think this is the one for her. She asked about Celexa, or if Rene Kocher had any other suggestions.  I also asked about [facility] and she reports after her insurance it will cost $1900.00 for 20 classes and that's just something she can't do right now. Asking if there are any other places suggested?   Informed her I would touch base with Rene Kocher and call back

## 2021-07-31 NOTE — Telephone Encounter (Signed)
We can try Viibryd - also on her use as directed list.

## 2021-08-01 NOTE — Telephone Encounter (Signed)
Pt given information to stop Wellbutrin and try Viibryd. Starter pack given to start 10 mg daily. She has apt on 08/07/2021.  Also given counseling options of [facility] and [facility] both in [place].

## 2021-08-06 ENCOUNTER — Telehealth: Payer: Self-pay | Admitting: Adult Health

## 2021-08-06 NOTE — Telephone Encounter (Signed)
Groupt therapy info has been given to her. Ok to take medications together.

## 2021-08-06 NOTE — Telephone Encounter (Signed)
Please review

## 2021-08-06 NOTE — Telephone Encounter (Signed)
Christina Leonard called to RS her appt because she hasn't been on the Viibryd long enough to know for sure how it is working.  She is still on the lower dose.  However she is taking during the day and it seems to make her more anxious.  She is wondering if she can take it in the evening, but she takes her Xanax at bedtime and wants to make sure they would be ok to take together. Please call to discuss.  Also, she said you were checking on group therapy for her and was wanting to know who or where you recommend for her.

## 2021-08-06 NOTE — Telephone Encounter (Signed)
LVM to rtc 

## 2021-08-06 NOTE — Telephone Encounter (Signed)
LVM with info

## 2021-08-07 ENCOUNTER — Ambulatory Visit: Payer: PRIVATE HEALTH INSURANCE | Admitting: Adult Health

## 2021-08-08 DIAGNOSIS — H16142 Punctate keratitis, left eye: Secondary | ICD-10-CM | POA: Diagnosis not present

## 2021-08-09 ENCOUNTER — Other Ambulatory Visit: Payer: Self-pay | Admitting: Adult Health

## 2021-08-09 DIAGNOSIS — G4700 Insomnia, unspecified: Secondary | ICD-10-CM

## 2021-08-10 ENCOUNTER — Telehealth: Payer: Self-pay | Admitting: Adult Health

## 2021-08-10 NOTE — Telephone Encounter (Signed)
I was on the phone with the patient about a medication issue and she mentioned that she was told the Viibryd will require a PA.  She gave me a phone # of [phone] to call. She said she can't afford much and would like to know cost before getting an Rx.

## 2021-08-10 NOTE — Telephone Encounter (Signed)
Have her take it in the morning

## 2021-08-10 NOTE — Telephone Encounter (Signed)
Pt advised she is taking samples of Viibryd 20mg  since her appt with Gina on 11/8.  She was taking it around 7pm, a couple of hours before her Ambien.  But she woke up at about 3am and was anxious.  She is wondering if she should try taking it at another time of day.  Next appt 12/15

## 2021-08-10 NOTE — Telephone Encounter (Signed)
See patient message

## 2021-08-10 NOTE — Telephone Encounter (Signed)
PA submitted. Pt can also use a co-pay card for cost. She should have received it with her samples

## 2021-08-13 NOTE — Telephone Encounter (Signed)
We can try switching to the Trintellix - 5mg  x 7, then increase to 10mg  daily. She needs some type of support - [facility] IOP?

## 2021-08-13 NOTE — Telephone Encounter (Signed)
We can try Effexor or Mirtazapine - also approved on [facility].

## 2021-08-13 NOTE — Telephone Encounter (Signed)
Pt LVM stating the Viibryd is not working whether she takes it in the morning or at night.  She has taken it 1 time (20mg ).  It is adding more agitation and making her more "down".  She is having a bad time.  She can come in today if needed.   Next appt 12/15

## 2021-08-13 NOTE — Telephone Encounter (Signed)
Called patient with recommendation of Trentellix. She said she took previously and it didn't work. She is asking about Prozac or Celexa. She said she took Celexa before and she didn't think she got much benefit, but willing to try again. She said she seems to be sensitive to SE of medications. She said she had contacted [facility] about IOP and they were only doing online and she wanted in person to get her out of the house. She said she is taking the Xanax BID, 1 qam, 1 qpm, and then Ambien at night. She said since she lives by herself she is concerned about interaction with medications/too much sedation.

## 2021-08-13 NOTE — Telephone Encounter (Signed)
I've not spoken with the pt today but just to remind you, she did try to get in with [facility] but the co-pay was too expensive with her insurance.

## 2021-08-13 NOTE — Telephone Encounter (Signed)
Noted. How did she taper onto the medication and how long has she been on it? Has she looked into the IOP program?

## 2021-08-13 NOTE — Telephone Encounter (Signed)
See long string of messages. Please advise.

## 2021-08-14 ENCOUNTER — Other Ambulatory Visit: Payer: Self-pay | Admitting: Adult Health

## 2021-08-14 ENCOUNTER — Other Ambulatory Visit: Payer: Self-pay | Admitting: Family

## 2021-08-14 DIAGNOSIS — I10 Essential (primary) hypertension: Secondary | ICD-10-CM

## 2021-08-14 DIAGNOSIS — F410 Panic disorder [episodic paroxysmal anxiety] without agoraphobia: Secondary | ICD-10-CM

## 2021-08-14 DIAGNOSIS — F411 Generalized anxiety disorder: Secondary | ICD-10-CM

## 2021-08-14 NOTE — Telephone Encounter (Signed)
Looked through patient's medication history available in Epic and there is no Effexor or mirtazapine on the list. Left VM for patient to [facility].

## 2021-08-14 NOTE — Telephone Encounter (Signed)
Called patient and told gave your recommendations. She said she had taken those before medications before. She said all the medications from [employer] are not working for her. She also said that she is getting chest pain before she ever gets out of the bed in the morning. She knows that it is probably anxiety related, but wanted you to be aware of it.

## 2021-08-14 NOTE — Telephone Encounter (Signed)
If she is having chest pains, she should see her PCP to confirm no issues. When was the last time she took the medications suggested?

## 2021-08-15 NOTE — Telephone Encounter (Signed)
Called patient and had a lot of background noise - unable to hear and she hung up. Will try later.

## 2021-08-15 NOTE — Telephone Encounter (Signed)
Patient has request refill on medication "Losartan". Patient is taking 100mg  of medication but has requested both 100mg  and 50mg . I tried to approve one and deny the other but warnings appeared. Medication pend and sent to PCP Ngetich, , NP for approval and deny. Please Advise.

## 2021-08-16 MED ORDER — BUSPIRONE HCL 5 MG PO TABS: 2.5000 mg | ORAL_TABLET | Freq: Three times a day (TID) | ORAL | 0 refills | Status: DC

## 2021-08-16 NOTE — Addendum Note (Signed)
Addended by: Karin Lieu T on: 08/16/2021 05:12 PM   Modules accepted: Orders

## 2021-08-16 NOTE — Addendum Note (Signed)
Addended by: Karin Lieu T on: 08/16/2021 05:24 PM   Modules accepted: Orders

## 2021-08-16 NOTE — Telephone Encounter (Signed)
Rtc to pt and gave her instructions for the Buspar, informed her that Buspar is a safe drug to take with her other medications and we use it a lot for anxiety. She agreed to try. Rx will be sent to her pharmacy.

## 2021-08-16 NOTE — Telephone Encounter (Signed)
Called patient back. She said she had called after I left a message, but I don't see a message.  She is not taking Wellbutrin as she said it increased her anxiety.  She said Effexor caused SI and increased eating. I don't see it or mirtazapine in her medical history and she can't tell me how long ago she had taken it. She talked with a nurse with her insurance company and she recommended Buspar.  She wants to be sure that any meds given do not interact with her alprazolam or Ambien.  She is waking up with anxiety due to her situation, but said the alprazolam can make her sleepy and she doesn't want that.  She is taking it she said and thinks maybe if she takes half a tablet that will help, just wants something to "take the edge off." She asks if there is another anxiety medication she can take.

## 2021-08-16 NOTE — Telephone Encounter (Signed)
She can try the buspar - 5mg  TID - 1/2 tab TID x 7, then one tab TID.

## 2021-08-17 ENCOUNTER — Other Ambulatory Visit: Payer: Self-pay

## 2021-08-17 MED ORDER — BUSPIRONE HCL 5 MG PO TABS: ORAL_TABLET | ORAL | 0 refills | Status: DC

## 2021-08-17 NOTE — Telephone Encounter (Signed)
Rx updated with instructions and resubmitted this morning

## 2021-08-22 ENCOUNTER — Ambulatory Visit: Payer: BC Managed Care – PPO | Admitting: Family

## 2021-08-23 ENCOUNTER — Encounter: Payer: Self-pay | Admitting: Adult Health

## 2021-08-23 ENCOUNTER — Ambulatory Visit (INDEPENDENT_AMBULATORY_CARE_PROVIDER_SITE_OTHER): Payer: PRIVATE HEALTH INSURANCE | Admitting: Adult Health

## 2021-08-23 ENCOUNTER — Other Ambulatory Visit: Payer: Self-pay

## 2021-08-23 DIAGNOSIS — G4700 Insomnia, unspecified: Secondary | ICD-10-CM

## 2021-08-23 DIAGNOSIS — F331 Major depressive disorder, recurrent, moderate: Secondary | ICD-10-CM

## 2021-08-23 DIAGNOSIS — F410 Panic disorder [episodic paroxysmal anxiety] without agoraphobia: Secondary | ICD-10-CM

## 2021-08-23 DIAGNOSIS — F411 Generalized anxiety disorder: Secondary | ICD-10-CM | POA: Diagnosis not present

## 2021-08-23 MED ORDER — ZOLPIDEM TARTRATE 10 MG PO TABS: ORAL_TABLET | ORAL | 2 refills | Status: AC

## 2021-08-23 MED ORDER — FLUOXETINE HCL 10 MG PO CAPS: 10.0000 mg | ORAL_CAPSULE | Freq: Every day | ORAL | 2 refills | Status: DC

## 2021-08-23 MED ORDER — ALPRAZOLAM 0.25 MG PO TABS: 0.2500 mg | ORAL_TABLET | Freq: Three times a day (TID) | ORAL | 2 refills | Status: AC | PRN

## 2021-08-23 NOTE — Progress Notes (Signed)
Christina Leonard [id number] [birth year] 50 y.o.  Subjective:   Patient ID:  Christina Leonard is a 50 y.o. (DOB Sep 29, 1970) female.  Chief Complaint: No chief complaint on file.   HPI Merdith Adan presents to the office today for follow-up of MDD, ADHD, GAD, insomnia, and panic attacks.  Describes mood today as "not good". Pleasant, flat. Denies tearfulness - "it's stuck and won't come out". Pleasant. Mood symptoms - reports depression, anxiety and irritability. Reports panic attacks. Has not tolerated any newly prescribed  medications. Would like to try the Prozac - has taken in the past and thought it was helpful. Continues to struggle with the separation from husband. They are having minimal contact and only discussing moving forward to dissolve marriage. Mother is visiting her from [place]. Decreased interest and motivation. Taking medications as prescribed. Energy levels low - "big time". Active, does not have a regular exercise routine. Walking dogs. Has been to a Yoga class. Unable to enjoy usual interests and activities. Married. Lives alone with 2 dogs. Recently separated from husband x 25 years. Two daughters 82 and 73. Daughters are in [place]. Moved to [place] from [place] for husband's job. Spending time with family. Appetite decreased. Weight loss - 127.6 63". Sleeps better some nights than others.  Averages 7 hours with Ambien. Focus and concentration difficulties - "not good". Completing tasks.Completing self care. Managing aspects of household. Unemployed. Denies SI or HI.  Denies AH or VH.  One kidney x 12 years ago - working with PCP to manage BP.  Previous medications: Zoloft, Lexapro, Prozac, Celexa, Pristiq, Abilify - twitches, Pristiq, Cymbalta   Flowsheet Row ED from 03/30/2021 in [facility] EMERGENCY DEPARTMENT  C-SSRS RISK CATEGORY No Risk        Review of Systems:  Review of Systems  Musculoskeletal:  Negative for gait problem.  Neurological:  Negative  for tremors.  Psychiatric/Behavioral:         Please refer to HPI   Medications: I have reviewed the patient's current medications.  Current Outpatient Medications  Medication Sig Dispense Refill   FLUoxetine (PROZAC) 10 MG capsule Take 1 capsule (10 mg total) by mouth daily. 30 capsule 2   albuterol (VENTOLIN HFA) 108 (90 Base) MCG/ACT inhaler Inhale 2 puffs into the lungs every 6 (six) hours as needed for wheezing or shortness of breath. 8 g 3   ALPRAZolam (XANAX) 0.25 MG tablet Take 1 tablet (0.25 mg total) by mouth 3 (three) times daily as needed for anxiety. 90 tablet 2   buPROPion (WELLBUTRIN XL) 150 MG 24 hr tablet Take 1 tablet (150 mg total) by mouth daily. 30 tablet 0   busPIRone (BUSPAR) 5 MG tablet Take 1/2 tablet (2.5 mg) by mouth three times a day for 7 days, then increase to 1 tablet (5 mg) three times a day. 90 tablet 0   citalopram (CELEXA) 40 MG tablet Take 1 tablet (40 mg total) by mouth daily. (prescribed by Psych) (Patient not taking: No sig reported) 90 tablet 1   losartan (COZAAR) 100 MG tablet TAKE 1 TABLET BY MOUTH EVERY DAY 90 tablet 1   metoprolol succinate (TOPROL-XL) 25 MG 24 hr tablet TAKE 1 TABLET BY MOUTH EVERY DAY ALONG WITH THE 50MG  TO TOTAL 75MG  DAILY (Patient not taking: No sig reported) 90 tablet 1   metoprolol succinate (TOPROL-XL) 50 MG 24 hr tablet 1 TABLET ONCE DAILY ALONG WITH 25MG  TO TOTAL 75MG  DAILY. TAKE WITH OR IMMEDIATELY FOLLOWING A MEAL (Patient not taking: No sig  reported) 90 tablet 1   ondansetron (ZOFRAN) 4 MG tablet Take 1 tablet (4 mg total) by mouth every 8 (eight) hours as needed for nausea or vomiting. (Patient not taking: Reported on 07/16/2021) 20 tablet 0   VITAMIN D PO Take 1 tablet by mouth as needed (when patient remembers to take).     zolpidem (AMBIEN) 10 MG tablet TAKE 1 TABLET BY MOUTH EVERYDAY AT BEDTIME 30 tablet 2   No current facility-administered medications for this visit.    Medication Side Effects: None  Allergies:   Allergies  Allergen Reactions   Benzocaine     Other reaction(s): Other (See Comments)   Benzocaine-Menthol    Cholecalciferol    Cymbalta [Duloxetine Hcl] Itching   Hydrocodone Other (See Comments)   Other     Other reaction(s): Unknown Other reaction(s): Unknown   Skin Adhesives [Cyanoacrylate]    Chlorhexidine Rash and Hives   Codeine Other (See Comments)    Stomach problems patient reports   Iodinated Diagnostic Agents Rash   Penicillins Rash   Sulfa Antibiotics Rash and Other (See Comments)    Other reaction(s): Unknown   Tape Rash    Past Medical History:  Diagnosis Date   Anxiety    Arthritis    Chronic kidney disease 2008   Nephrectomy    Depression    High blood pressure    High cholesterol    Insomnia     Past Medical History, Surgical history, Social history, and Family history were reviewed and updated as appropriate.   Please see review of systems for further details on the patient's review from today.   Objective:   Physical Exam:  There were no vitals taken for this visit.  Physical Exam Constitutional:      General: She is not in acute distress. Musculoskeletal:        General: No deformity.  Neurological:     Mental Status: She is alert and oriented to person, place, and time.     Coordination: Coordination normal.  Psychiatric:        Attention and Perception: Attention and perception normal. She does not perceive auditory or visual hallucinations.        Mood and Affect: Mood normal. Mood is not anxious or depressed. Affect is not labile, blunt, angry or inappropriate.        Speech: Speech normal.        Behavior: Behavior normal.        Thought Content: Thought content normal. Thought content is not paranoid or delusional. Thought content does not include homicidal or suicidal ideation. Thought content does not include homicidal or suicidal plan.        Cognition and Memory: Cognition and memory normal.        Judgment: Judgment normal.      Comments: Insight intact    Lab Review:     Component Value Date/Time   NA 137 04/02/2021 1331   K 4.6 04/02/2021 1331   CL 103 04/02/2021 1331   CO2 25 04/02/2021 1331   GLUCOSE 86 04/02/2021 1331   BUN 10 04/02/2021 1331   CREATININE 0.89 04/02/2021 1331   CALCIUM 9.7 04/02/2021 1331   PROT 7.4 03/30/2021 1601   ALBUMIN 4.0 03/30/2021 1601   AST 21 03/30/2021 1601   ALT 15 03/30/2021 1601   ALKPHOS 29 (L) 03/30/2021 1601   BILITOT 1.2 03/30/2021 1601   GFRNONAA >60 03/30/2021 1601   GFRNONAA 70 07/31/2020 0823   GFRAA 82 07/31/2020  9323       Component Value Date/Time   WBC 10.7 04/02/2021 1331   RBC 5.26 (H) 04/02/2021 1331   HGB 16.5 (H) 04/02/2021 1331   HCT 48.1 (H) 04/02/2021 1331   PLT 373 04/02/2021 1331   MCV 91.4 04/02/2021 1331   MCH 31.4 04/02/2021 1331   MCHC 34.3 04/02/2021 1331   RDW 12.9 04/02/2021 1331   LYMPHSABS 2,750 04/02/2021 1331   MONOABS 0.7 03/30/2021 1601   EOSABS 96 04/02/2021 1331   BASOSABS 54 04/02/2021 1331    No results found for: POCLITH, LITHIUM   No results found for: PHENYTOIN, PHENOBARB, VALPROATE, CBMZ   .res Assessment: Plan:    Plan:  PDMP reviewed  Continue: Ambien 10mg  daily Xanax 0.25mg  daily to three times daily - taking twice a day Add Prozac 10mg  daily - may increase to 20mg  daily - has taken previously.   Plans to see a therapist  Initiated Genesight testing with multiple medication failures.  RTC 3 weeks or sooner as needed.  Diagnoses and all orders for this visit:  Major depressive disorder, recurrent episode, moderate (HCC) -     FLUoxetine (PROZAC) 10 MG capsule; Take 1 capsule (10 mg total) by mouth daily.  Panic attacks -     ALPRAZolam (XANAX) 0.25 MG tablet; Take 1 tablet (0.25 mg total) by mouth 3 (three) times daily as needed for anxiety.  Generalized anxiety disorder -     ALPRAZolam (XANAX) 0.25 MG tablet; Take 1 tablet (0.25 mg total) by mouth 3 (three) times daily as needed  for anxiety.  Insomnia, unspecified type -     zolpidem (AMBIEN) 10 MG tablet; TAKE 1 TABLET BY MOUTH EVERYDAY AT BEDTIME    Please see After Visit Summary for patient specific instructions.  Future Appointments  Date Time Provider Department Center  09/13/2021 10:20 AM Shed Nixon, , NP [facility] None    No orders of the defined types were placed in this encounter.   -------------------------------

## 2021-08-24 ENCOUNTER — Ambulatory Visit: Payer: BC Managed Care – PPO | Admitting: Family

## 2021-08-26 ENCOUNTER — Other Ambulatory Visit: Payer: Self-pay | Admitting: Adult Health

## 2021-08-29 ENCOUNTER — Telehealth: Payer: Self-pay

## 2021-08-29 MED ORDER — CLONIDINE HCL 0.1 MG PO TABS: 0.0500 mg | ORAL_TABLET | Freq: Every day | ORAL | 0 refills | Status: DC

## 2021-08-29 NOTE — Telephone Encounter (Signed)
VM left to RC.  

## 2021-08-29 NOTE — Telephone Encounter (Signed)
The fluoxetine to 1 capsule every other day until she gets used to it.  Sometimes it will increase anxiety in the first week or 2 before it starts reducing anxiety around week 3. In the meantime the most rapidly effective thing I could give her for anxiety would be clonidine 1/2 tablet at night and then after a couple of days if she tolerates it half tablet twice daily.  It would also help sleep.  The main side effect to watch out for is possible dizziness in the first couple of days.  It probably will cause some dry mouth but that is usually not that big a deal.  It could help her feel less anxious and sleep better the first night Please send in prescription for clonidine 0.1 mg tablet 1/2 tablet nightly for 3 nights and then 1/2 tablet twice daily

## 2021-08-30 NOTE — Telephone Encounter (Signed)
Recommendations given to her. She doesn't want to continue taking Prozac, even QOD because it makes her drowsy. She doesn't want to take clonidine because she knows it can lower her BP. She asks to go back on Celexa. I asked why she wants to restart it and she said she didn't think it worked, but that the [employer] were less. She seems to be worried about having a medical crisis at night and she is living by herself. She said she is also having social anxiety now.

## 2021-08-30 NOTE — Telephone Encounter (Signed)
Called patient back and let her know that Almira Coaster will not change her medications because she is not giving medications time to work. Dr. Alwyn Ren recommendations were again given. She asks what if they don't work. I told her that someone would be on-call and in the event of a crisis she could call/go to [facility] or the ER. She knew where [facility] was located. She again asks about Prozac interfering with her other medications. I reassured her that Almira Coaster prescribed the medications and is aware of her concerns and would not prescribe any medications there were not safe to use as prescribed.

## 2021-09-05 ENCOUNTER — Telehealth: Payer: Self-pay | Admitting: Adult Health

## 2021-09-05 NOTE — Telephone Encounter (Signed)
I called patient again and she said she can't take the Prozac, even at QOD. She said she is having heart palpitations really bad and then she is weak the next day. She is asking now about trying Wellbutrin again. She said the SE were less and at least it allowed her to be able to go grocery shopping. She said social anxiety is so bad right now she can't go anywhere that there are a lot of people. I tried again to explain to her that she isn't giving any medication enough of a trial to see if it will work. She said she wants to get better and that it isn't that she doesn't want to take the medication, she just can't tolerate the SE. Again she mentions concerns about living by herself and if there were an issue with her medications there is no one to call 911. I mentioned the possibility of going IP and she said she doesn't need to go to the hospital.

## 2021-09-05 NOTE — Telephone Encounter (Signed)
Have her continue the Prozac for now. It will take some time to work.

## 2021-09-05 NOTE — Telephone Encounter (Signed)
LVM for patient to RC.

## 2021-09-06 ENCOUNTER — Other Ambulatory Visit: Payer: Self-pay

## 2021-09-06 MED ORDER — VENLAFAXINE HCL ER 37.5 MG PO CP24: 37.5000 mg | ORAL_CAPSULE | Freq: Every day | ORAL | 0 refills | Status: DC

## 2021-09-06 NOTE — Telephone Encounter (Signed)
Has she tried the Effexor? There are a few others on the gene sight results she hasn't tried.

## 2021-09-06 NOTE — Telephone Encounter (Signed)
Please call patient to reschedule appt. Christina Leonard said she needs a 40 minute appt. Thank you.

## 2021-09-06 NOTE — Telephone Encounter (Signed)
Noted. No further changes without an appointment - 40 minutes.

## 2021-09-06 NOTE — Telephone Encounter (Signed)
LVM to RC 

## 2021-09-06 NOTE — Telephone Encounter (Signed)
Ok - we need 40 mins.

## 2021-09-06 NOTE — Telephone Encounter (Signed)
She already had an appt scheduled for 1/5, but it is only a 20-minute appt.  I will have adm reschedule.

## 2021-09-06 NOTE — Telephone Encounter (Signed)
I would rather not have her restart the Wellbutrin since it gave her heart palpitations. I would try the Effexor at 37.5mg  every morning for now.

## 2021-09-06 NOTE — Telephone Encounter (Signed)
I went thru patient's medication history in Epic. She has taken the following medications:  Wellbutrin 150 mg Buspar  Celexa Clonidine Prozac Atarax Latuda Pristiq Cymbalta.  I'm unsure of the reasons for discontinuing all these. I don't see Effexor in her history, but I feel like I asked her about this previously and she said she had problems with it. I'm sure she has already discontinued the Prozac and clonidine so a wean is probably not needed. If she restarts Wellbutrin does she need to start it at a lower dose than 150 mg? I will verify whether or not she has taken Effexor before letting her know that she can restart Wellbutrin. She will also ask if there were any possible interactions with her medications. I have told her in the past that you would not prescribe anything that would cause an interaction, but she always asks.  I think she has an appt with you next week. Do you want to try something new before then?

## 2021-09-06 NOTE — Telephone Encounter (Signed)
Called patient. She doesn't want to take Effexor, said she took it previously and had an issue with it, but doesn't remember what. I don't see it on her medication history in Epic. She again told me that she lives by herself and she doesn't want anything that is going to cause her problems. She doesn't want to take anything that would increase her anxiety, every time she takes a pill she feels it is stuck and the "cry won't come out." It isn't a physical feeling of being stuck she says when I recommended taking the medication with applesauce, yogurt, or ice cream. I told her I wasn't qualified enough to answer all her questions about the GeneSight and how the medications she is prescribed work - are they mood stabilizers, antidepressants, etc.  I told her that she could discuss all this with Gina at her appt next week. We also have a copy of the GeneSight for her that I told her we would give her next week.  She does not want to try the Effexor, but is willing to try it. I told her I would send in a one month supply for her. She is NOT taking the Prozac or clonidine prescribed by Dr. Jennelle Human last week.

## 2021-09-07 NOTE — Telephone Encounter (Signed)
Appt changed to Jan 12 at 11:40 for 40 mins

## 2021-09-11 ENCOUNTER — Encounter: Payer: Self-pay | Admitting: Family

## 2021-09-11 ENCOUNTER — Telehealth: Payer: Self-pay | Admitting: Adult Health

## 2021-09-11 ENCOUNTER — Other Ambulatory Visit: Payer: Self-pay

## 2021-09-11 ENCOUNTER — Ambulatory Visit: Payer: BC Managed Care – PPO | Admitting: Family

## 2021-09-11 VITALS — BP 130/82 | HR 75 | Temp 97.5°F | Resp 16 | Ht 63.0 in | Wt 132.0 lb

## 2021-09-11 DIAGNOSIS — F5231 Female orgasmic disorder: Secondary | ICD-10-CM | POA: Insufficient documentation

## 2021-09-11 DIAGNOSIS — F331 Major depressive disorder, recurrent, moderate: Secondary | ICD-10-CM | POA: Diagnosis not present

## 2021-09-11 DIAGNOSIS — F5101 Primary insomnia: Secondary | ICD-10-CM | POA: Diagnosis not present

## 2021-09-11 DIAGNOSIS — I10 Essential (primary) hypertension: Secondary | ICD-10-CM | POA: Diagnosis not present

## 2021-09-11 DIAGNOSIS — Z0001 Encounter for general adult medical examination with abnormal findings: Secondary | ICD-10-CM

## 2021-09-11 DIAGNOSIS — M159 Polyosteoarthritis, unspecified: Secondary | ICD-10-CM

## 2021-09-11 DIAGNOSIS — F411 Generalized anxiety disorder: Secondary | ICD-10-CM

## 2021-09-11 DIAGNOSIS — E782 Mixed hyperlipidemia: Secondary | ICD-10-CM

## 2021-09-11 DIAGNOSIS — M150 Primary generalized (osteo)arthritis: Secondary | ICD-10-CM

## 2021-09-11 DIAGNOSIS — N941 Unspecified dyspareunia: Secondary | ICD-10-CM | POA: Insufficient documentation

## 2021-09-11 NOTE — Telephone Encounter (Signed)
Called patient to tell her that she needs to take a medication for 4 weeks before she may see benefit, and that she isn't giving any medication time. She was seen by PCP for a physical today and all the medications prescribed by [place], except alprazolam and Ambien have been removed from her medication list. She said she is moving to [facility] in 4 weeks and can't deal with all these medications and where is she going to get refills, etc.

## 2021-09-11 NOTE — Telephone Encounter (Signed)
How many days has she been taking it now? She needs to give the medication 4 weeks.

## 2021-09-11 NOTE — Progress Notes (Signed)
Provider: Marlowe Sax FNP-C   Christina Leonard, Nelda Bucks, NP  Patient Care Team: Shondale Quinley, Nelda Bucks, NP as PCP - General (Family Medicine)  Extended Emergency Contact Information Primary Emergency Contact: anisah, kuck Mobile Phone: [phone] Relation: Spouse  Code Status:  Full Code  Goals of care: Advanced Directive information Advanced Directives 09/11/2021  Does Patient Have a Medical Advance Directive? No  Would patient like information on creating a medical advance directive? No - Patient declined     Chief Complaint  Patient presents with   Annual Exam    Physical     HPI:  Pt is a 51 y.o. female seen today for medical management of chronic diseases.she has a medical history Hypertension,Generalized Anxiety disorder,Osteoarthritis,major depression,Chronic Kidney Disease,Insomnia,Hyperlipidemia,Left nephrectomy done in 2008,stress incontinence ,chronic Fatigue among others.  She denies any new acute issues except states has had increased stress level due to separation with Husband. She follows up with her counselor. Not sure whether to relocate to [place] to be close to her mother and daughters.States one of the daughter is too busy with work " will just make me baby sit her kids " and the other daughter takes sides with the Husband. Her mother will not help either. States husband won't renew the house contract so she won't have anywhere to stay here in [place].she has no friends or family.Husband told her to sign some paperwork saying he will pay for her rent if she relocates to [place] but not sure whether the papers are official or he lie to her when she moves to [place]. States has no money to pay for a Chief Executive Officer.Each time she calls a lawyer the Husband monitors who she talks to since he pays for the phone.  Has not worked in years and does not think she call do any work due to her medical history of left Nephrectomy and ongoing depression.She is off all her antidepressant  states has had side effects so does not want to take them.    Past Medical History:  Diagnosis Date   Anxiety    Arthritis    Chronic kidney disease 2008   Nephrectomy    Depression    High blood pressure    High cholesterol    Insomnia    Past Surgical History:  Procedure Laterality Date   NEPHRECTOMY  2008   Done in [place]  2010   Dr. Lucia Bitter    Allergies  Allergen Reactions   Benzocaine     Other reaction(s): Other (See Comments)   Benzocaine-Menthol    Cholecalciferol    Cymbalta [Duloxetine Hcl] Itching   Hydrocodone Other (See Comments)   Other     Other reaction(s): Unknown Other reaction(s): Unknown   Skin Adhesives [Cyanoacrylate]    Chlorhexidine Rash and Hives   Codeine Other (See Comments)    Stomach problems patient reports   Iodinated Contrast Media Rash   Penicillins Rash   Sulfa Antibiotics Rash and Other (See Comments)    Other reaction(s): Unknown   Tape Rash    Allergies as of 09/11/2021       Reactions   Benzocaine    Other reaction(s): Other (See Comments)   Benzocaine-menthol    Cholecalciferol    Cymbalta [duloxetine Hcl] Itching   Hydrocodone Other (See Comments)   Other    Other reaction(s): Unknown Other reaction(s): Unknown   Skin Adhesives [cyanoacrylate]    Chlorhexidine Rash, Hives   Codeine Other (See Comments)   Stomach problems  patient reports   Iodinated Contrast Media Rash   Penicillins Rash   Sulfa Antibiotics Rash, Other (See Comments)   Other reaction(s): Unknown   Tape Rash        Medication List        Accurate as of September 11, 2021 11:34 AM. If you have any questions, ask your nurse or doctor.          STOP taking these medications    buPROPion 150 MG 24 hr tablet Commonly known as: WELLBUTRIN XL Stopped by: Sandrea Hughs, NP   busPIRone 5 MG tablet Commonly known as: BUSPAR Stopped by: Sandrea Hughs, NP   citalopram 40 MG tablet Commonly known as:  CELEXA Stopped by: Sandrea Hughs, NP   cloNIDine 0.1 MG tablet Commonly known as: Catapres Stopped by: Sandrea Hughs, NP   FLUoxetine 10 MG capsule Commonly known as: PROzac Stopped by: Sandrea Hughs, NP   metoprolol succinate 25 MG 24 hr tablet Commonly known as: TOPROL-XL Stopped by: Sandrea Hughs, NP   metoprolol succinate 50 MG 24 hr tablet Commonly known as: TOPROL-XL Stopped by: Nelda Bucks Davidson Palmieri, NP   ondansetron 4 MG tablet Commonly known as: Zofran Stopped by: Sandrea Hughs, NP   venlafaxine XR 37.5 MG 24 hr capsule Commonly known as: EFFEXOR-XR Stopped by: Sandrea Hughs, NP       TAKE these medications    albuterol 108 (90 Base) MCG/ACT inhaler Commonly known as: VENTOLIN HFA Inhale 2 puffs into the lungs every 6 (six) hours as needed for wheezing or shortness of breath.   ALPRAZolam 0.25 MG tablet Commonly known as: XANAX Take 1 tablet (0.25 mg total) by mouth 3 (three) times daily as needed for anxiety.   losartan 100 MG tablet Commonly known as: COZAAR TAKE 1 TABLET BY MOUTH EVERY DAY   VITAMIN D PO Take 1 tablet by mouth as needed (when patient remembers to take).   zolpidem 10 MG tablet Commonly known as: AMBIEN TAKE 1 TABLET BY MOUTH EVERYDAY AT BEDTIME        Review of Systems  Constitutional:  Negative for appetite change, chills, fatigue, fever and unexpected weight change.  HENT:  Negative for congestion, dental problem, ear discharge, ear pain, facial swelling, hearing loss, nosebleeds, postnasal drip, rhinorrhea, sinus pressure, sinus pain, sneezing, sore throat, tinnitus and trouble swallowing.   Eyes:  Negative for pain, discharge, redness, itching and visual disturbance.  Respiratory:  Negative for cough, chest tightness, shortness of breath and wheezing.   Cardiovascular:  Negative for chest pain, palpitations and leg swelling.  Gastrointestinal:  Negative for abdominal distention, abdominal pain, blood in stool,  constipation, diarrhea, nausea and vomiting.  Endocrine: Negative for cold intolerance, heat intolerance, polydipsia, polyphagia and polyuria.  Genitourinary:  Negative for difficulty urinating, dysuria, flank pain, frequency and urgency.  Musculoskeletal:  Negative for arthralgias, back pain, gait problem, joint swelling, myalgias, neck pain and neck stiffness.  Skin:  Negative for color change, pallor, rash and wound.  Neurological:  Negative for dizziness, syncope, speech difficulty, weakness, light-headedness, numbness and headaches.  Hematological:  Does not bruise/bleed easily.  Psychiatric/Behavioral:  Negative for agitation, behavioral problems, confusion, hallucinations, self-injury, sleep disturbance and suicidal ideas. The patient is not nervous/anxious.    Immunization History  Administered Date(s) Administered   PFIZER(Purple Top)SARS-COV-2 Vaccination 05/26/2020, 06/16/2020   Pertinent  Health Maintenance Due  Topic Date Due   PAP SMEAR-Modifier  Never done   COLONOSCOPY (Pts 45-89yrs Insurance  coverage will need to be confirmed)  Never done   INFLUENZA VACCINE  Never done   MAMMOGRAM  04/05/2022   Fall Risk 03/30/2021 04/02/2021 06/22/2021 07/05/2021 09/11/2021  Falls in the past year? - 0 1 0 0  Was there an injury with Fall? - 0 1 0 0  Fall Risk Category Calculator - 0 3 0 0  Fall Risk Category - Low High Low Low  Patient Fall Risk Level Low fall risk Low fall risk High fall risk Low fall risk Low fall risk  Patient at Risk for Falls Due to - No Fall Risks History of fall(s) No Fall Risks No Fall Risks  Fall risk Follow up - Falls evaluation completed Falls evaluation completed;Education provided;Falls prevention discussed Falls evaluation completed Falls evaluation completed   Functional Status Survey:    Vitals:   09/11/21 1053  BP: 130/82  Pulse: 75  Resp: 16  Temp: (!) 97.5 F (36.4 C)  SpO2: 97%  Weight: 132 lb (59.9 kg)  Height: 5\' 3"  (1.6 m)   Body mass  index is 23.38 kg/m. Physical Exam Vitals reviewed.  Constitutional:      General: She is not in acute distress.    Appearance: Normal appearance. She is normal weight. She is not ill-appearing or diaphoretic.  HENT:     Head: Normocephalic.     Right Ear: Tympanic membrane, ear canal and external ear normal. There is no impacted cerumen.     Left Ear: Tympanic membrane, ear canal and external ear normal. There is no impacted cerumen.     Nose: Nose normal. No congestion or rhinorrhea.     Mouth/Throat:     Mouth: Mucous membranes are moist.     Pharynx: Oropharynx is clear. No oropharyngeal exudate or posterior oropharyngeal erythema.  Eyes:     General: No scleral icterus.       Right eye: No discharge.        Left eye: No discharge.     Extraocular Movements: Extraocular movements intact.     Conjunctiva/sclera: Conjunctivae normal.     Pupils: Pupils are equal, round, and reactive to light.  Neck:     Vascular: No carotid bruit.  Cardiovascular:     Rate and Rhythm: Normal rate and regular rhythm.     Pulses: Normal pulses.     Heart sounds: Normal heart sounds. No murmur heard.   No friction rub. No gallop.  Pulmonary:     Effort: Pulmonary effort is normal. No respiratory distress.     Breath sounds: Normal breath sounds. No wheezing, rhonchi or rales.  Chest:     Chest wall: No tenderness.  Abdominal:     General: Bowel sounds are normal. There is no distension.     Palpations: Abdomen is soft. There is no mass.     Tenderness: There is no abdominal tenderness. There is no right CVA tenderness, left CVA tenderness, guarding or rebound.  Musculoskeletal:        General: No swelling or tenderness. Normal range of motion.     Cervical back: Normal range of motion. No rigidity or tenderness.     Right lower leg: No edema.     Left lower leg: No edema.  Lymphadenopathy:     Cervical: No cervical adenopathy.  Skin:    General: Skin is warm and dry.     Coloration: Skin  is not pale.     Findings: No bruising, erythema, lesion or rash.  Neurological:  Mental Status: She is alert and oriented to person, place, and time.     Cranial Nerves: No cranial nerve deficit.     Sensory: No sensory deficit.     Motor: No weakness.     Coordination: Coordination normal.     Gait: Gait normal.  Psychiatric:        Mood and Affect: Mood is depressed.        Speech: Speech normal.        Behavior: Behavior normal.        Thought Content: Thought content normal.        Judgment: Judgment normal.    Labs reviewed: Recent Labs    03/30/21 1601 04/02/21 1331  NA 138 137  K 3.9 4.6  CL 104 103  CO2 25 25  GLUCOSE 101* 86  BUN 13 10  CREATININE 1.03* 0.89  CALCIUM 9.1 9.7   Recent Labs    03/30/21 1601  AST 21  ALT 15  ALKPHOS 29*  BILITOT 1.2  PROT 7.4  ALBUMIN 4.0   Recent Labs    03/30/21 1601 04/02/21 1331  WBC 11.4* 10.7  NEUTROABS 7.3 7,158  HGB 16.4* 16.5*  HCT 48.1* 48.1*  MCV 90.8 91.4  PLT 357 373   Lab Results  Component Value Date   TSH 1.66 07/31/2020   No results found for: HGBA1C Lab Results  Component Value Date   CHOL 157 07/31/2020   HDL 38 (L) 07/31/2020   LDLCALC 88 07/31/2020   TRIG 214 (H) 07/31/2020   CHOLHDL 4.1 07/31/2020    Significant Diagnostic Results in last 30 days:  No results found.  Assessment/Plan  1. Encounter for general adult medical examination with abnormal findings Declined all vaccine  - CBC with Differential/Platelet - CMP with eGFR([facility]) - Lipid Panel - TSH  2. Primary insomnia Zolpidem effective   3. Essential hypertension B/p well controlled  - continue on losartan  - CBC with Differential/Platelet - CMP with eGFR([facility]) - Lipid Panel - TSH  4. Moderate episode of recurrent major depressive disorder ([place]) Off all antidepressant  No suicide ideation or harm to self or others Continues to be depressed due to ongoing separation with Husband.Recommend antidepressant  but states had side effects to all antidepressant previous tried.very difficult to manage. Continue to follow up with Psychiatrist and counselor  -Advised to call 9-1-1  and 1-800 number for suicide provided on AVS   - TSH  5. Generalized anxiety disorder Continue on Alprazolam   6. Mixed hyperlipidemia Continue on Atorvastatin  - Lipid Panel  7. Primary osteoarthritis involving multiple joints Continue on OTC analgesic as needed   Family/ staff Communication: Reviewed plan of care with patient verbalized understanding   Labs/tests ordered:   - CBC with Differential/Platelet - CMP with eGFR([facility]) - Lipid Panel - TSH  Next Appointment : one year for Annual Physical Examination   Sandrea Hughs, NP

## 2021-09-11 NOTE — Telephone Encounter (Signed)
Pt called and LM on VM 10/20am. She is taking Effexor but it is making her extremely tired and sleepy. She cannot function in the day to day like this. Please advise.

## 2021-09-11 NOTE — Telephone Encounter (Signed)
Noted  

## 2021-09-11 NOTE — Telephone Encounter (Signed)
See phone message. I haven't called her yet. Wanted to have a game plan in place first. I know you said no more changes until she is seen in the office, which I think is now 1/13.

## 2021-09-11 NOTE — Telephone Encounter (Signed)
I sent in a script on 12/28 or 12/29 for 37.5 mg. She was seen by PCP today and it was marked as discontinued, as well as all other psych meds except alprazolam and Ambien. I know she needs to take the medication for 4 weeks, but she never does.

## 2021-09-12 LAB — CBC WITH DIFFERENTIAL/PLATELET
Absolute Monocytes: 488 cells/uL (ref 200–950)
Basophils Absolute: 53 cells/uL (ref 0–200)
Basophils Relative: 0.7 %
Eosinophils Absolute: 68 cells/uL (ref 15–500)
Eosinophils Relative: 0.9 %
HCT: 42.3 % (ref 35.0–45.0)
Hemoglobin: 14.3 g/dL (ref 11.7–15.5)
Lymphs Abs: 2123 cells/uL (ref 850–3900)
MCH: 31.2 pg (ref 27.0–33.0)
MCHC: 33.8 g/dL (ref 32.0–36.0)
MCV: 92.2 fL (ref 80.0–100.0)
MPV: 10.4 fL (ref 7.5–12.5)
Monocytes Relative: 6.5 %
Neutro Abs: 4770 cells/uL (ref 1500–7800)
Neutrophils Relative %: 63.6 %
Platelets: 328 10*3/uL (ref 140–400)
RBC: 4.59 10*6/uL (ref 3.80–5.10)
RDW: 12 % (ref 11.0–15.0)
Total Lymphocyte: 28.3 %
WBC: 7.5 10*3/uL (ref 3.8–10.8)

## 2021-09-12 LAB — COMPLETE METABOLIC PANEL WITH GFR
AG Ratio: 1.5 (calc) (ref 1.0–2.5)
ALT: 7 U/L (ref 6–29)
AST: 11 U/L (ref 10–35)
Albumin: 4.2 g/dL (ref 3.6–5.1)
Alkaline phosphatase (APISO): 33 U/L — ABNORMAL LOW (ref 37–153)
BUN/Creatinine Ratio: 8 (calc) (ref 6–22)
BUN: 6 mg/dL — ABNORMAL LOW (ref 7–25)
CO2: 28 mmol/L (ref 20–32)
Calcium: 9.7 mg/dL (ref 8.6–10.4)
Chloride: 106 mmol/L (ref 98–110)
Creat: 0.71 mg/dL (ref 0.50–1.03)
Globulin: 2.8 g/dL (calc) (ref 1.9–3.7)
Glucose, Bld: 85 mg/dL (ref 65–139)
Potassium: 4.9 mmol/L (ref 3.5–5.3)
Sodium: 138 mmol/L (ref 135–146)
Total Bilirubin: 0.9 mg/dL (ref 0.2–1.2)
Total Protein: 7 g/dL (ref 6.1–8.1)
eGFR: 104 mL/min/{1.73_m2} (ref >=60)

## 2021-09-12 LAB — LIPID PANEL
Cholesterol: 206 mg/dL — ABNORMAL HIGH (ref <200)
HDL: 45 mg/dL — ABNORMAL LOW (ref >=50)
LDL Cholesterol (Calc): 127 mg/dL (calc) — ABNORMAL HIGH
Non-HDL Cholesterol (Calc): 161 mg/dL (calc) — ABNORMAL HIGH (ref <130)
Total CHOL/HDL Ratio: 4.6 (calc) (ref <5.0)
Triglycerides: 199 mg/dL — ABNORMAL HIGH (ref <150)

## 2021-09-12 LAB — TSH: TSH: 1.07 mIU/L

## 2021-09-13 ENCOUNTER — Ambulatory Visit: Payer: PRIVATE HEALTH INSURANCE | Admitting: Adult Health

## 2021-09-13 ENCOUNTER — Telehealth: Payer: Self-pay | Admitting: *Deleted

## 2021-09-13 MED ORDER — ATORVASTATIN CALCIUM 10 MG PO TABS: 10.0000 mg | ORAL_TABLET | Freq: Every day | ORAL | 3 refills | Status: AC

## 2021-09-13 NOTE — Telephone Encounter (Signed)
Atorvastatin send to pharmacy

## 2021-09-13 NOTE — Telephone Encounter (Signed)
Pended Rx and sent to [facility] for approval due to HIGH ALERT Warning.  ?

## 2021-09-13 NOTE — Telephone Encounter (Signed)
-----   Message from Caesar Bookman, NP sent at 09/13/2021  9:24 AM EST ----- All lab results are normal except cholesterol,Triglycerides and LDL are high recommend starting of Atorvastatin 10 mg tablet one by mouth daily.

## 2021-09-20 ENCOUNTER — Encounter: Payer: Self-pay | Admitting: Adult Health

## 2021-09-20 ENCOUNTER — Ambulatory Visit (INDEPENDENT_AMBULATORY_CARE_PROVIDER_SITE_OTHER): Payer: PRIVATE HEALTH INSURANCE | Admitting: Adult Health

## 2021-09-20 ENCOUNTER — Other Ambulatory Visit: Payer: Self-pay

## 2021-09-20 DIAGNOSIS — F410 Panic disorder [episodic paroxysmal anxiety] without agoraphobia: Secondary | ICD-10-CM | POA: Diagnosis not present

## 2021-09-20 DIAGNOSIS — F331 Major depressive disorder, recurrent, moderate: Secondary | ICD-10-CM | POA: Diagnosis not present

## 2021-09-20 DIAGNOSIS — M25562 Pain in left knee: Secondary | ICD-10-CM | POA: Diagnosis not present

## 2021-09-20 DIAGNOSIS — F909 Attention-deficit hyperactivity disorder, unspecified type: Secondary | ICD-10-CM

## 2021-09-20 DIAGNOSIS — F411 Generalized anxiety disorder: Secondary | ICD-10-CM | POA: Diagnosis not present

## 2021-09-20 DIAGNOSIS — G4700 Insomnia, unspecified: Secondary | ICD-10-CM

## 2021-09-20 DIAGNOSIS — M4802 Spinal stenosis, cervical region: Secondary | ICD-10-CM | POA: Diagnosis not present

## 2021-09-20 DIAGNOSIS — M545 Low back pain, unspecified: Secondary | ICD-10-CM | POA: Diagnosis not present

## 2021-09-20 MED ORDER — CITALOPRAM HYDROBROMIDE 20 MG PO TABS: ORAL_TABLET | ORAL | 2 refills | Status: DC

## 2021-09-20 NOTE — Progress Notes (Signed)
Christina Leonard Willers [id number] 02/28/1971 51 y.o.  Subjective:   Patient ID:  Christina Leonard Bordeau is a 51 y.o. (DOB 02/28/1971) female.  Chief Complaint: No chief complaint on file.   HPI Christina Leonard Maina presents to the office today for follow-up of MDD, ADHD, GAD, insomnia, and panic attacks.  Describes mood today as "not any better". Pleasant, flat. Denies tearfulness. Pleasant. Mood symptoms - reports depression, anxiety and irritability. Reports panic attacks. Recently started on Effexor and stopped after a few days - reports feeling sleepy and a knot in her throat. Continues to struggle with the separation from husband. Reports financial difficulties. Trying to decide if she is moving to [place] or staying local - mother and daughters in [place].  Decreased interest and motivation. Taking medications as prescribed. Energy levels low. Active, does not have a regular exercise routine. Walking dogs. Unable to enjoy usual interests and activities. Married. Lives alone with 2 dogs. Recently separated from husband x 25 years. Two daughters [id number] and [id number]. Daughters are in [place]. Moved to [place] from [place] for husband's job. Spending time with family. Appetite decreased. Weight loss - 3 pounds 127.6 63". Sleeps better some nights than others.  Averages 6 hours with Ambien. Focus and concentration difficulties. Completing tasks.Completing self care. Managing aspects of household. Unemployed. Denies SI or HI.  Denies AH or VH.  One kidney x 12 years ago - working with PCP to manage BP.  Previous medications: Zoloft, Lexapro, Prozac, Celexa, Pristiq, Abilify - twitches, Pristiq, Cymbalta   [facility]  C-SSRS RISK CATEGORY No Risk        Review of Systems:  Review of Systems  Musculoskeletal:  Negative for gait problem.  Neurological:  Negative for tremors.  Psychiatric/Behavioral:         Please refer to HPI   Medications: I have  reviewed the patient's current medications.  Current Outpatient Medications  Medication Sig Dispense Refill   citalopram (CELEXA) 20 MG tablet Take 1/2 tablet daily for up to 7 days, then increase to one tablet daily. 30 tablet 2   albuterol (VENTOLIN HFA) 108 (90 Base) MCG/ACT inhaler Inhale 2 puffs into the lungs every 6 (six) hours as needed for wheezing or shortness of breath. 8 g 3   ALPRAZolam (XANAX) 0.25 MG tablet Take 1 tablet (0.25 mg total) by mouth 3 (three) times daily as needed for anxiety. 90 tablet 2   atorvastatin (LIPITOR) 10 MG tablet Take 1 tablet (10 mg total) by mouth daily. 30 tablet 3   losartan (COZAAR) 100 MG tablet TAKE 1 TABLET BY MOUTH EVERY DAY 90 tablet 1   VITAMIN D PO Take 1 tablet by mouth as needed (when patient remembers to take).     zolpidem (AMBIEN) 10 MG tablet TAKE 1 TABLET BY MOUTH EVERYDAY AT BEDTIME 30 tablet 2   No current facility-administered medications for this visit.    Medication Side Effects: None  Allergies:  Allergies  Allergen Reactions   Benzocaine     Other reaction(s): Other (See Comments)   Benzocaine-Menthol    Cholecalciferol    Cymbalta [Duloxetine Hcl] Itching   Hydrocodone Other (See Comments)   Other     Other reaction(s): Unknown Other reaction(s): Unknown   Skin Adhesives [Cyanoacrylate]    Chlorhexidine Rash and Hives   Codeine Other (See Comments)    Stomach problems patient reports   Iodinated Contrast Media Rash   Penicillins Rash   Sulfa Antibiotics Rash  and Other (See Comments)    Other reaction(s): Unknown   Tape Rash    Past Medical History:  Diagnosis Date   Anxiety    Arthritis    Chronic kidney disease 2008   Nephrectomy    Depression    High blood pressure    High cholesterol    Insomnia     Past Medical History, Surgical history, Social history, and Family history were reviewed and updated as appropriate.   Please see review of systems for further details on the patient's review from  today.   Objective:   Physical Exam:  There were no vitals taken for this visit.  Physical Exam Constitutional:      General: She is not in acute distress. Musculoskeletal:        General: No deformity.  Neurological:     Mental Status: She is alert and oriented to person, place, and time.     Coordination: Coordination normal.  Psychiatric:        Attention and Perception: Attention and perception normal. She does not perceive auditory or visual hallucinations.        Mood and Affect: Mood normal. Mood is not anxious or depressed. Affect is not labile, blunt, angry or inappropriate.        Speech: Speech normal.        Behavior: Behavior normal.        Thought Content: Thought content normal. Thought content is not paranoid or delusional. Thought content does not include homicidal or suicidal ideation. Thought content does not include homicidal or suicidal plan.        Cognition and Memory: Cognition and memory normal.        Judgment: Judgment normal.     Comments: Insight intact    Lab Review:     Component Value Date/Time   NA 138 09/11/2021 1143   K 4.9 09/11/2021 1143   CL 106 09/11/2021 1143   CO2 28 09/11/2021 1143   GLUCOSE 85 09/11/2021 1143   BUN 6 (L) 09/11/2021 1143   CREATININE 0.71 09/11/2021 1143   CALCIUM 9.7 09/11/2021 1143   PROT 7.0 09/11/2021 1143   ALBUMIN 4.0 03/30/2021 1601   AST 11 09/11/2021 1143   ALT 7 09/11/2021 1143   ALKPHOS 29 (L) 03/30/2021 1601   BILITOT 0.9 09/11/2021 1143   GFRNONAA >60 03/30/2021 1601   GFRNONAA 70 07/31/2020 0823   GFRAA 82 07/31/2020 0823       Component Value Date/Time   WBC 7.5 09/11/2021 1143   RBC 4.59 09/11/2021 1143   HGB 14.3 09/11/2021 1143   HCT 42.3 09/11/2021 1143   PLT 328 09/11/2021 1143   MCV 92.2 09/11/2021 1143   MCH 31.2 09/11/2021 1143   MCHC 33.8 09/11/2021 1143   RDW 12.0 09/11/2021 1143   LYMPHSABS 2,123 09/11/2021 1143   MONOABS 0.7 03/30/2021 1601   EOSABS 68 09/11/2021 1143    BASOSABS 53 09/11/2021 1143    No results found for: POCLITH, LITHIUM   No results found for: PHENYTOIN, PHENOBARB, VALPROATE, CBMZ   .res Assessment: Plan:    Plan:  PDMP reviewed  Continue: Ambien 10mg  daily Xanax 0.25mg  daily to three times daily - taking twice a day Add Prozac 10mg  daily - may increase to 20mg  daily - has taken previously.   Plans to see a therapist  Initiated [facility] testing with multiple medication failures.  RTC 3 weeks or sooner as needed. Diagnoses and all orders for this visit:  Major  depressive disorder, recurrent episode, moderate (H[facility]) -     citalopram (CELEXA) 20 MG tablet; Take 1/2 tablet daily for up to 7 days, then increase to one tablet daily.  Panic attacks  Generalized anxiety disorder  Insomnia, unspecified type  Attention deficit hyperactivity disorder (ADHD), unspecified ADHD type     Please see After Visit Summary for patient specific instructions.  No future appointments.  No orders of the defined types were placed in this encounter.   -------------------------------

## 2021-10-01 ENCOUNTER — Telehealth: Payer: Self-pay | Admitting: Adult Health

## 2021-10-01 NOTE — Telephone Encounter (Signed)
Pt left a message that she would like to talk to regina. Please call her at [phone]

## 2021-10-02 DIAGNOSIS — M5412 Radiculopathy, cervical region: Secondary | ICD-10-CM | POA: Diagnosis not present

## 2021-10-02 NOTE — Telephone Encounter (Signed)
Have her set up an appointment to discuss - 40 minutes.

## 2021-10-02 NOTE — Telephone Encounter (Signed)
Rtc to pt and she reports she has been taking Celexa 20 mg 1/2 tablet and her symptoms are worsening, she reports hopelessness, much more depressed, racing thoughts and bizarre thoughts. She is just eating and sitting, not moving. She now is having social anxiety and won't leave the house to get items from the store. She is also maybe having paranoia: thoughts of preparing things if something happens such as she thinks maybe give her neighbors a key to her house.   Informed her I would update Rene Kocher and we would follow up.

## 2021-10-02 NOTE — Telephone Encounter (Signed)
Can you please call for Christina Leonard

## 2021-10-02 NOTE — Telephone Encounter (Signed)
She is coming in on thursday

## 2021-10-04 ENCOUNTER — Other Ambulatory Visit: Payer: Self-pay | Admitting: Adult Health

## 2021-10-04 ENCOUNTER — Ambulatory Visit: Payer: PRIVATE HEALTH INSURANCE | Admitting: Adult Health

## 2021-10-04 DIAGNOSIS — F331 Major depressive disorder, recurrent, moderate: Secondary | ICD-10-CM

## 2021-10-04 MED ORDER — RISPERIDONE 0.5 MG PO TABS: 0.5000 mg | ORAL_TABLET | Freq: Every day | ORAL | 2 refills | Status: AC

## 2021-10-04 NOTE — Progress Notes (Signed)
Patient no show appointment. ? ?

## 2021-11-02 ENCOUNTER — Other Ambulatory Visit: Payer: Self-pay | Admitting: Family

## 2021-11-02 DIAGNOSIS — I10 Essential (primary) hypertension: Secondary | ICD-10-CM

## 2021-11-02 MED ORDER — LOSARTAN POTASSIUM 100 MG PO TABS: 100.0000 mg | ORAL_TABLET | Freq: Every day | ORAL | 1 refills | Status: AC

## 2021-11-02 NOTE — Telephone Encounter (Signed)
High risk or very high risk warning populated when attempting to refill medication. RX request sent to PCP for review and approval if warranted.   

## 2022-08-25 IMAGING — US US ABDOMEN COMPLETE
1 series · 13 of 25 positions shown · non-contrast
Comparison: None.

CLINICAL DATA: Abdominal pain

EXAM:
ABDOMEN ULTRASOUND COMPLETE

[Series 1: us abdomen complete · 0.20mm/px · 13 of 78 slices shown]
[im 1/78]
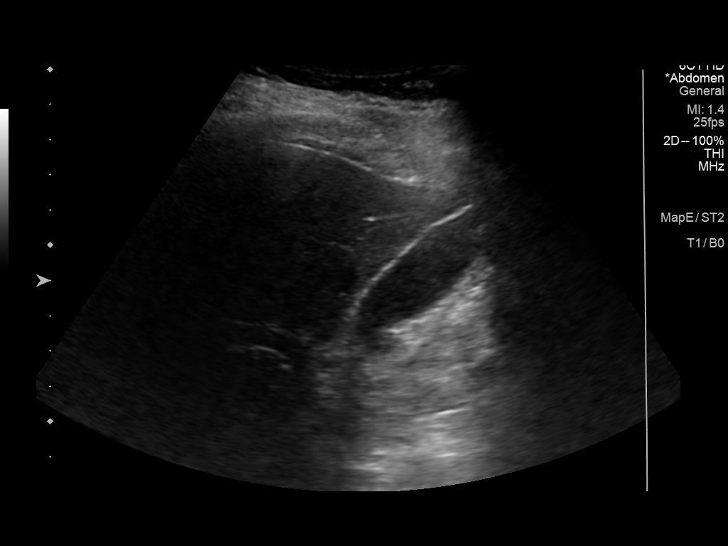
[im 7/78]
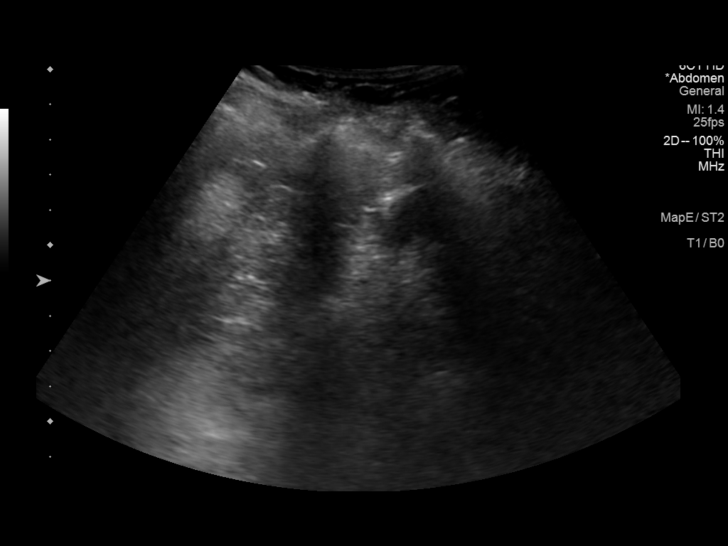
[im 13/78]
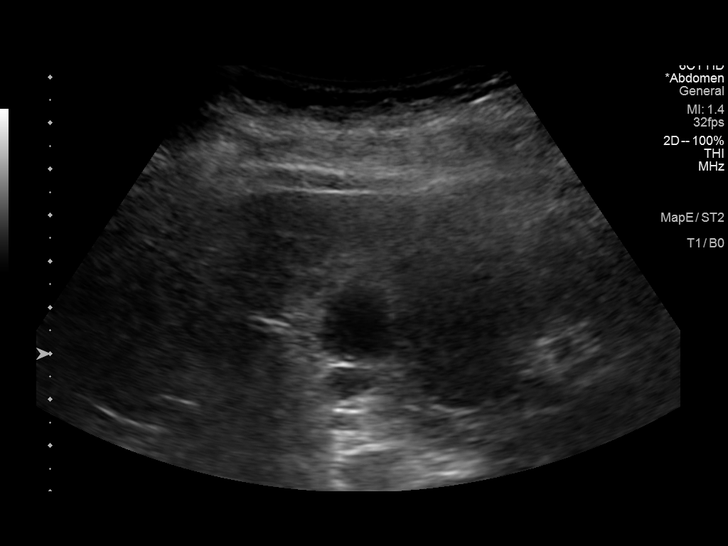
[im 20/78]
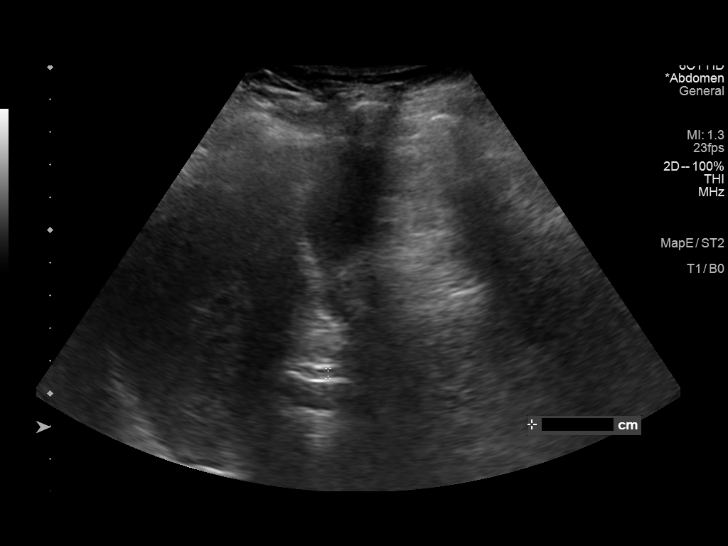
[im 26/78]
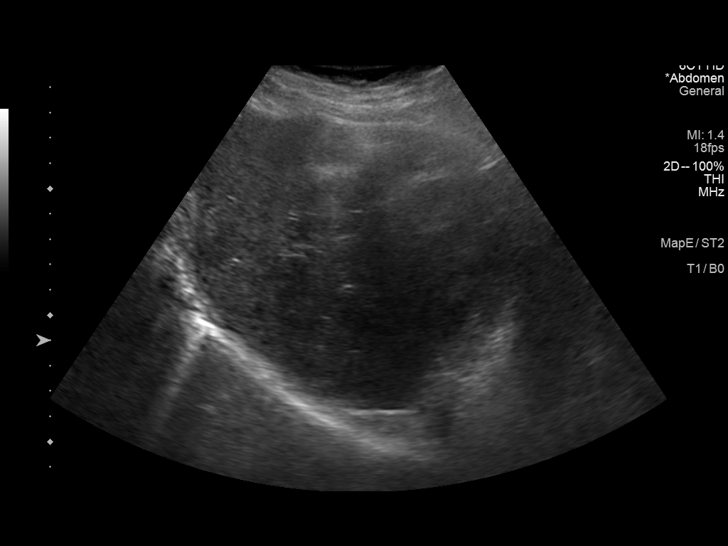
[im 33/78]
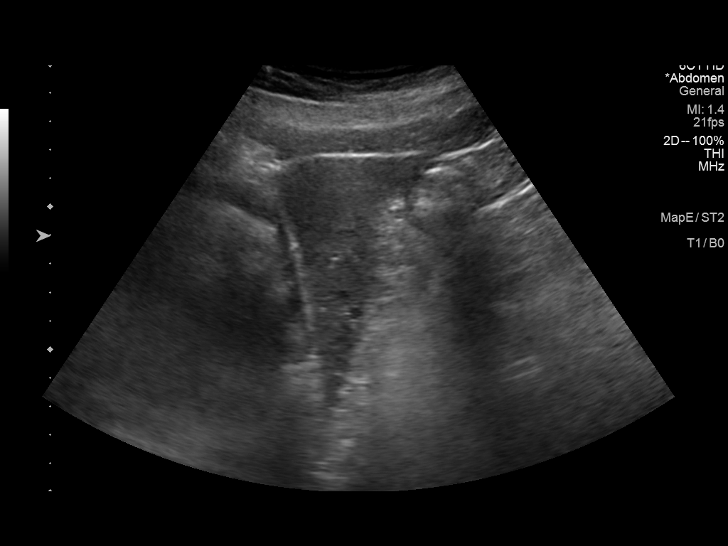
[im 39/78]
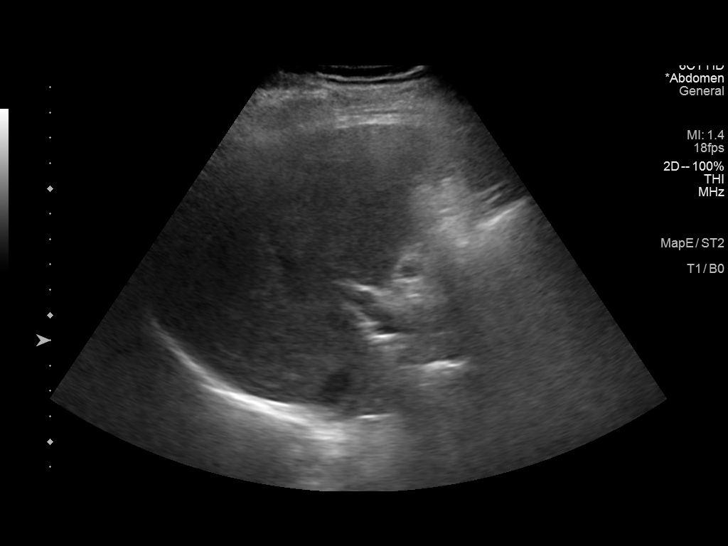
[im 45/78]
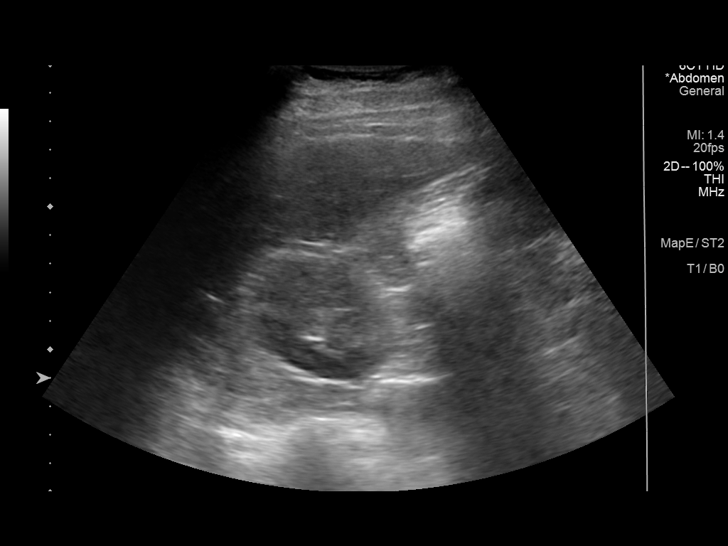
[im 52/78]
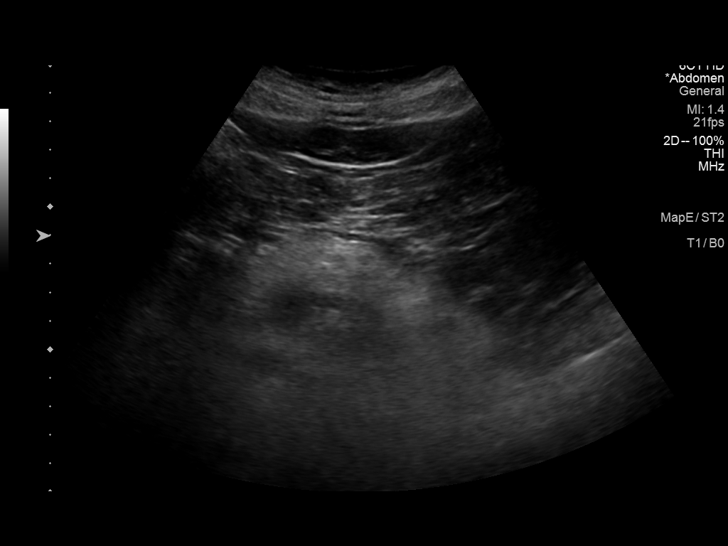
[im 58/78]
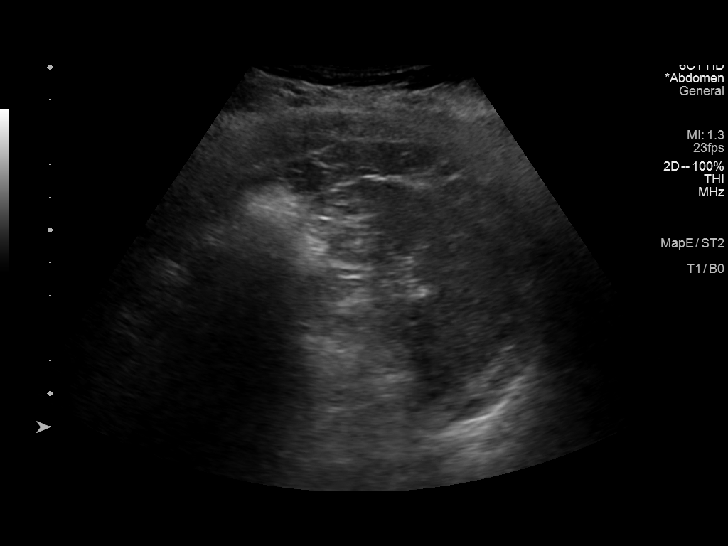
[im 65/78]
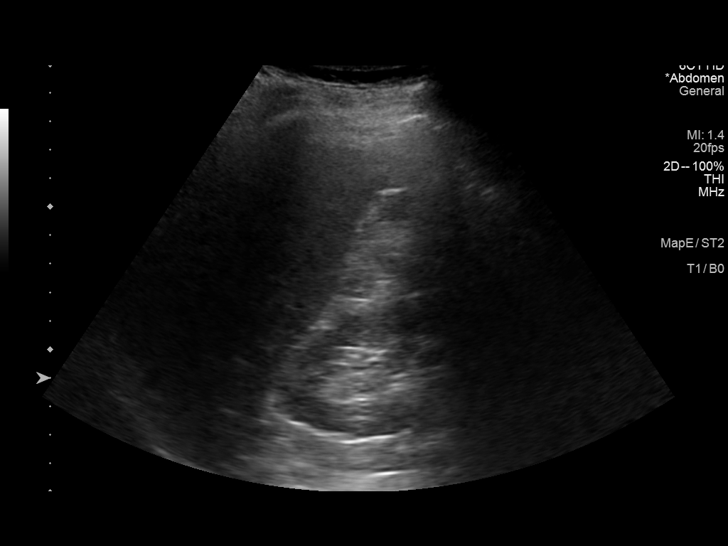
[im 71/78]
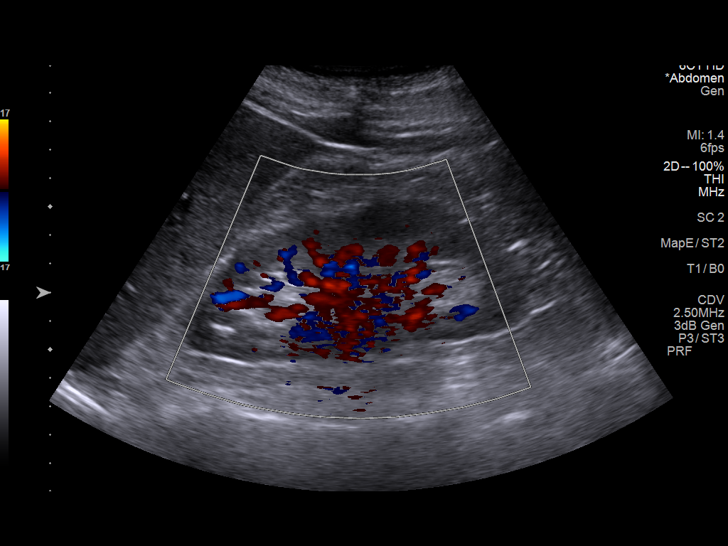
[im 78/78]
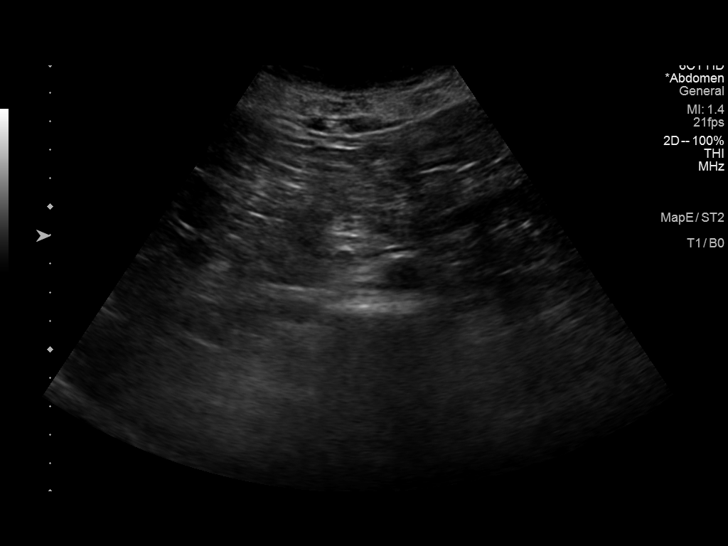

[13 of 25 positions shown; findings below may reference images not displayed]

FINDINGS: Gallbladder: No gallstones or wall thickening visualized. There is
no pericholecystic fluid. Focus of ring down type artifact noted
along the wall of the gallbladder suggesting inflammatory focus such
as adenomyomatosis or cholesterolosis. No sonographic Murphy sign
noted by sonographer.

Common bile duct: Diameter: 3 mm. No intrahepatic, common hepatic,
or common bile duct dilatation.

Liver: No focal lesion identified. Within normal limits in
parenchymal echogenicity. Portal vein is patent on color Doppler
imaging with normal direction of blood flow towards the liver.

IVC: No abnormality visualized.

Pancreas: No pancreatic mass or inflammatory focus.

Spleen: Size and appearance within normal limits.

Right Kidney: Length: 13.3 cm. Echogenicity within normal limits. No
mass or hydronephrosis visualized.

Left Kidney: Surgically absent.

Abdominal aorta: No aneurysm visualized.

Other findings: No evidence ascites.
IMPRESSION: 1. Apparent ring down type artifact along the anterior gallbladder
wall, a finding likely due to inflammatory focus such as
adenomyomatosis or cholesterolosis. No gallstones, gallbladder wall
thickening, or pericholecystic fluid.

2.  Left kidney absent.

3.  Study otherwise unremarkable.

## 2022-11-05 IMAGING — NM NM HEPATO W/GB/PHARM/[PERSON_NAME]
2 series · 12 of 12 positions shown · non-contrast
Comparison: Ultrasound dated September 06, 2020

CLINICAL DATA: Right upper abdominal pain

EXAM:
NUCLEAR MEDICINE HEPATOBILIARY IMAGING WITH GALLBLADDER EF
TECHNIQUE: Sequential images of the abdomen were obtained [DATE] minutes
following intravenous administration of radiopharmaceutical. After
oral ingestion of Ensure, gallbladder ejection fraction was
determined. At 60 min, normal ejection fraction is greater than 33%.
RADIOPHARMACEUTICALS:  5.5 mCi Ac-66m  Choletec IV

[he hepatobiliary · 4.52mm/px · 6 of 60 frames shown (1 of 2)]
[frame 6/60]
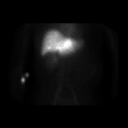
[frame 16/60]
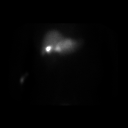
[frame 26/60]
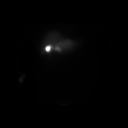
[frame 36/60]
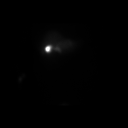
[frame 46/60]
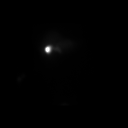
[frame 56/60]
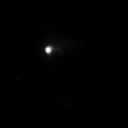

[he hepatobiliary · 4.52mm/px · 6 of 60 frames shown (2 of 2)]
[frame 6/60]
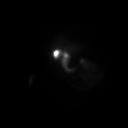
[frame 16/60]
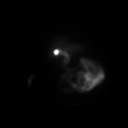
[frame 26/60]
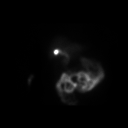
[frame 36/60]
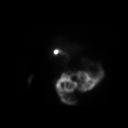
[frame 46/60]
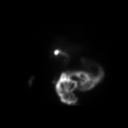
[frame 56/60]
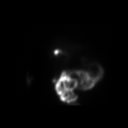

[12 of 12 positions shown; findings below may reference images not displayed]

FINDINGS: Prompt uptake and biliary excretion of activity by the liver is
seen. Gallbladder activity is visualized, consistent with patency of
cystic duct. Biliary activity passes into small bowel, consistent
with patent common bile duct.

Calculated gallbladder ejection fraction is 85%. (Normal gallbladder
ejection fraction with Ensure is greater than 33%.)
IMPRESSION: Normal study with a normal gallbladder ejection fraction.
